# Patient Record
Sex: Female | Born: 1940 | Race: White | Hispanic: No | Marital: Married | Smoking: Former smoker
Health system: Southern US, Community
[De-identification: ages and names within clinical notes are randomized; demographics above are authoritative.]

## PROBLEM LIST (undated history)

## (undated) DIAGNOSIS — I639 Cerebral infarction, unspecified: Secondary | ICD-10-CM

## (undated) HISTORY — PX: CARDIAC SURGERY: SHX584

## (undated) HISTORY — PX: PACEMAKER INSERTION: SHX728

## (undated) HISTORY — PX: COLON SURGERY: SHX602

---

## 2020-11-18 ENCOUNTER — Emergency Department (HOSPITAL_COMMUNITY): Payer: Medicare PPO

## 2020-11-18 ENCOUNTER — Encounter (HOSPITAL_COMMUNITY): Payer: Self-pay | Admitting: *Deleted

## 2020-11-18 ENCOUNTER — Other Ambulatory Visit: Payer: Self-pay

## 2020-11-18 ENCOUNTER — Inpatient Hospital Stay (HOSPITAL_COMMUNITY)
Admission: EM | Admit: 2020-11-18 | Discharge: 2020-11-21 | DRG: 190 | Disposition: A | Discharge status: Home / Self Care | Payer: Medicare PPO | Attending: Family Medicine | Admitting: Family Medicine

## 2020-11-18 DIAGNOSIS — R7989 Other specified abnormal findings of blood chemistry: Secondary | ICD-10-CM

## 2020-11-18 DIAGNOSIS — Z8673 Personal history of transient ischemic attack (TIA), and cerebral infarction without residual deficits: Secondary | ICD-10-CM | POA: Diagnosis not present

## 2020-11-18 DIAGNOSIS — D6489 Other specified anemias: Secondary | ICD-10-CM | POA: Diagnosis present

## 2020-11-18 DIAGNOSIS — E1169 Type 2 diabetes mellitus with other specified complication: Secondary | ICD-10-CM

## 2020-11-18 DIAGNOSIS — I10 Essential (primary) hypertension: Secondary | ICD-10-CM | POA: Diagnosis not present

## 2020-11-18 DIAGNOSIS — Z7901 Long term (current) use of anticoagulants: Secondary | ICD-10-CM

## 2020-11-18 DIAGNOSIS — N184 Chronic kidney disease, stage 4 (severe): Secondary | ICD-10-CM

## 2020-11-18 DIAGNOSIS — I2510 Atherosclerotic heart disease of native coronary artery without angina pectoris: Secondary | ICD-10-CM | POA: Diagnosis present

## 2020-11-18 DIAGNOSIS — I5032 Chronic diastolic (congestive) heart failure: Secondary | ICD-10-CM | POA: Diagnosis present

## 2020-11-18 DIAGNOSIS — E1165 Type 2 diabetes mellitus with hyperglycemia: Secondary | ICD-10-CM | POA: Diagnosis present

## 2020-11-18 DIAGNOSIS — J189 Pneumonia, unspecified organism: Secondary | ICD-10-CM

## 2020-11-18 DIAGNOSIS — R9431 Abnormal electrocardiogram [ECG] [EKG]: Secondary | ICD-10-CM

## 2020-11-18 DIAGNOSIS — I440 Atrioventricular block, first degree: Secondary | ICD-10-CM | POA: Diagnosis present

## 2020-11-18 DIAGNOSIS — Z87891 Personal history of nicotine dependence: Secondary | ICD-10-CM | POA: Diagnosis not present

## 2020-11-18 DIAGNOSIS — I2782 Chronic pulmonary embolism: Secondary | ICD-10-CM

## 2020-11-18 DIAGNOSIS — I2692 Saddle embolus of pulmonary artery without acute cor pulmonale: Secondary | ICD-10-CM

## 2020-11-18 DIAGNOSIS — Z79899 Other long term (current) drug therapy: Secondary | ICD-10-CM | POA: Diagnosis not present

## 2020-11-18 DIAGNOSIS — Z20822 Contact with and (suspected) exposure to covid-19: Secondary | ICD-10-CM | POA: Diagnosis present

## 2020-11-18 DIAGNOSIS — F419 Anxiety disorder, unspecified: Secondary | ICD-10-CM | POA: Diagnosis present

## 2020-11-18 DIAGNOSIS — I160 Hypertensive urgency: Secondary | ICD-10-CM

## 2020-11-18 DIAGNOSIS — E785 Hyperlipidemia, unspecified: Secondary | ICD-10-CM | POA: Diagnosis present

## 2020-11-18 DIAGNOSIS — I130 Hypertensive heart and chronic kidney disease with heart failure and stage 1 through stage 4 chronic kidney disease, or unspecified chronic kidney disease: Secondary | ICD-10-CM | POA: Diagnosis present

## 2020-11-18 DIAGNOSIS — I361 Nonrheumatic tricuspid (valve) insufficiency: Secondary | ICD-10-CM | POA: Diagnosis not present

## 2020-11-18 DIAGNOSIS — E1122 Type 2 diabetes mellitus with diabetic chronic kidney disease: Secondary | ICD-10-CM | POA: Diagnosis present

## 2020-11-18 DIAGNOSIS — I2699 Other pulmonary embolism without acute cor pulmonale: Secondary | ICD-10-CM

## 2020-11-18 DIAGNOSIS — K219 Gastro-esophageal reflux disease without esophagitis: Secondary | ICD-10-CM

## 2020-11-18 DIAGNOSIS — H109 Unspecified conjunctivitis: Secondary | ICD-10-CM

## 2020-11-18 DIAGNOSIS — I2602 Saddle embolus of pulmonary artery with acute cor pulmonale: Secondary | ICD-10-CM

## 2020-11-18 DIAGNOSIS — I482 Chronic atrial fibrillation, unspecified: Secondary | ICD-10-CM

## 2020-11-18 DIAGNOSIS — H1089 Other conjunctivitis: Secondary | ICD-10-CM | POA: Diagnosis present

## 2020-11-18 DIAGNOSIS — R0602 Shortness of breath: Secondary | ICD-10-CM | POA: Diagnosis present

## 2020-11-18 DIAGNOSIS — R0689 Other abnormalities of breathing: Secondary | ICD-10-CM

## 2020-11-18 DIAGNOSIS — J9601 Acute respiratory failure with hypoxia: Secondary | ICD-10-CM | POA: Diagnosis present

## 2020-11-18 DIAGNOSIS — R778 Other specified abnormalities of plasma proteins: Secondary | ICD-10-CM | POA: Diagnosis not present

## 2020-11-18 DIAGNOSIS — Z950 Presence of cardiac pacemaker: Secondary | ICD-10-CM

## 2020-11-18 DIAGNOSIS — R0600 Dyspnea, unspecified: Secondary | ICD-10-CM

## 2020-11-18 DIAGNOSIS — Z794 Long term (current) use of insulin: Secondary | ICD-10-CM | POA: Diagnosis not present

## 2020-11-18 DIAGNOSIS — Z951 Presence of aortocoronary bypass graft: Secondary | ICD-10-CM

## 2020-11-18 DIAGNOSIS — I272 Pulmonary hypertension, unspecified: Secondary | ICD-10-CM | POA: Diagnosis present

## 2020-11-18 DIAGNOSIS — J441 Chronic obstructive pulmonary disease with (acute) exacerbation: Secondary | ICD-10-CM | POA: Diagnosis not present

## 2020-11-18 HISTORY — DX: Cerebral infarction, unspecified: I63.9

## 2020-11-18 LAB — CBC WITH DIFFERENTIAL/PLATELET
Abs Immature Granulocytes: 0.06 10*3/uL (ref 0.00–0.07)
Basophils Absolute: 0 10*3/uL (ref 0.0–0.1)
Basophils Relative: 0 %
Eosinophils Absolute: 0.1 10*3/uL (ref 0.0–0.5)
Eosinophils Relative: 1 %
HCT: 33.9 % — ABNORMAL LOW (ref 36.0–46.0)
Hemoglobin: 11.1 g/dL — ABNORMAL LOW (ref 12.0–15.0)
Immature Granulocytes: 1 %
Lymphocytes Relative: 9 %
Lymphs Abs: 0.7 10*3/uL (ref 0.7–4.0)
MCH: 31.8 pg (ref 26.0–34.0)
MCHC: 32.7 g/dL (ref 30.0–36.0)
MCV: 97.1 fL (ref 80.0–100.0)
Monocytes Absolute: 1.2 10*3/uL — ABNORMAL HIGH (ref 0.1–1.0)
Monocytes Relative: 16 %
Neutro Abs: 5.5 10*3/uL (ref 1.7–7.7)
Neutrophils Relative %: 73 %
Platelets: 168 10*3/uL (ref 150–400)
RBC: 3.49 MIL/uL — ABNORMAL LOW (ref 3.87–5.11)
RDW: 13.8 % (ref 11.5–15.5)
WBC: 7.5 10*3/uL (ref 4.0–10.5)
nRBC: 0 % (ref 0.0–0.2)

## 2020-11-18 LAB — BASIC METABOLIC PANEL
Anion gap: 10 (ref 5–15)
BUN: 48 mg/dL — ABNORMAL HIGH (ref 8–23)
CO2: 20 mmol/L — ABNORMAL LOW (ref 22–32)
Calcium: 8.5 mg/dL — ABNORMAL LOW (ref 8.9–10.3)
Chloride: 105 mmol/L (ref 98–111)
Creatinine, Ser: 2.71 mg/dL — ABNORMAL HIGH (ref 0.44–1.00)
GFR, Estimated: 17 mL/min — ABNORMAL LOW (ref >60)
Glucose, Bld: 184 mg/dL — ABNORMAL HIGH (ref 70–99)
Potassium: 3.5 mmol/L (ref 3.5–5.1)
Sodium: 135 mmol/L (ref 135–145)

## 2020-11-18 LAB — TROPONIN I (HIGH SENSITIVITY): Troponin I (High Sensitivity): 174 ng/L (ref <18)

## 2020-11-18 LAB — RESP PANEL BY RT-PCR (FLU A&B, COVID) ARPGX2
Influenza A by PCR: NEGATIVE
Influenza B by PCR: NEGATIVE
SARS Coronavirus 2 by RT PCR: NEGATIVE

## 2020-11-18 LAB — D-DIMER, QUANTITATIVE: D-Dimer, Quant: 2.85 ug/mL-FEU — ABNORMAL HIGH (ref 0.00–0.50)

## 2020-11-18 MED ORDER — SODIUM CHLORIDE 0.9 % IV SOLN
2.0000 g | Freq: Once | INTRAVENOUS | Status: AC
  Administered 2020-11-18: 2 g via INTRAVENOUS
  Filled 2020-11-18: qty 20

## 2020-11-18 MED ORDER — ERYTHROMYCIN 5 MG/GM OP OINT
TOPICAL_OINTMENT | Freq: Four times a day (QID) | OPHTHALMIC | Status: DC
  Administered 2020-11-19 – 2020-11-20 (×3): 1 via OPHTHALMIC
  Filled 2020-11-18: qty 3.5

## 2020-11-18 MED ORDER — ERYTHROMYCIN 5 MG/GM OP OINT
TOPICAL_OINTMENT | Freq: Four times a day (QID) | OPHTHALMIC | Status: DC
  Filled 2020-11-18: qty 3.5

## 2020-11-18 MED ORDER — SODIUM CHLORIDE 0.9 % IV BOLUS
1000.0000 mL | Freq: Once | INTRAVENOUS | Status: AC
  Administered 2020-11-18: 1000 mL via INTRAVENOUS

## 2020-11-18 MED ORDER — SODIUM CHLORIDE 0.9 % IV SOLN
500.0000 mg | INTRAVENOUS | Status: DC
  Administered 2020-11-18: 500 mg via INTRAVENOUS
  Filled 2020-11-18: qty 500

## 2020-11-18 NOTE — H&P (Signed)
History and Physical  Aryani Daffern DXI:[id number] DOB: Jun 10, 1941 DOA: 11/18/2020  Referring physician: Maudie Flakes, MD PCP: Quentin Cornwall, MD  Patient coming from: Home  Chief Complaint: Shortness of breath  HPI: Kelly Pierce is a 80 y.o. female with medical history significant for hypertension, hyperlipidemia, T2 DM, GERD, stroke on Eliquis and CKD stage IV who presents to the emergency department via EMS accompanied by daughter-in-law due to 1 week of shortness of breath, chest congestion and cough with production of occasional greenish phlegm.  Shortness of breath worsens with ambulation and she complained of some chest soreness from frequent coughing. She also complained of right eye redness and discharge which started 2 days ago.  She denies loss of taste or smell, she also denies fever, headache, headache or abdominal pain. She lives at home with her husband and she ambulates with a cane.  ED Course:  In the emergency department, she was intermittently tachypneic, BP was 216/87.  O2 sat was in the high 80s to low 90s in the ED waiting room, so she was placed on supplemental oxygen via an [place] at 2 LPM with improvement in O2 sat.  Work-up in the ED showed normocytic anemia, BUN to creatinine 40/2.71 (no prior labs for comparison).  D-dimer 2.85, troponin I 174.  Respiratory panel for influenza A, B and SARS coronavirus 2 was negative.  Chest x-ray showed chronic appearing bronchitic type lung changes without acute cardiopulmonary findings. She was empirically treated with IV ceftriaxone and azithromycin, IV hydration was provided. Hospitalist was asked to admit patient for further evaluation and management.  Review of Systems: Constitutional: Negative for chills and fever.  HENT: Negative for ear pain and sore throat.   Eyes: Negative for pain and visual disturbance.  Respiratory: Positive for cough and shortness of breath.   Cardiovascular: Positive for chest soreness (due to frequent  coughing).  Negative for palpitations.  Gastrointestinal: Negative positive for constipation.  For abdominal pain and vomiting.  Endocrine: Negative for polyphagia and polyuria.  Genitourinary: Negative for decreased urine volume, dysuria, enuresis Musculoskeletal: Negative for arthralgias and back pain.  Skin: Negative for color change and rash.  Allergic/Immunologic: Negative for immunocompromised state.  Neurological: Negative for tremors, syncope, speech difficulty, weakness, light-headedness and headaches.  Hematological: Does not bruise/bleed easily.  All other systems reviewed and are negative    Past Medical History:  Diagnosis Date  . Stroke [facility])    Past Surgical History:  Procedure Laterality Date  . CARDIAC SURGERY    . COLON SURGERY    . PACEMAKER INSERTION      Social History:  reports that she has quit smoking. She has quit using smokeless tobacco. She reports previous alcohol use. She reports that she does not use drugs.   Allergies  Allergen Reactions  . Amoxicillin-Pot Clavulanate Rash  . Penicillins Rash    History reviewed. No pertinent family history.   Prior to Admission medications   Medication Sig Start Date End Date Taking? Authorizing Provider  ascorbic acid (VITAMIN C) 500 MG tablet Take 500 mg by mouth daily.   Yes [provider]  atorvastatin (LIPITOR) 80 MG tablet Take 80 mg by mouth at bedtime. 11/07/20  Yes [provider]  Calcium Carbonate-Vitamin D 600-200 MG-UNIT TABS Take 1 tablet by mouth daily.   Yes [provider]  carvedilol (COREG) 25 MG tablet Take 25 mg by mouth 2 (two) times daily. 10/25/20  Yes [provider]  clonazePAM (KLONOPIN) 1 MG tablet Take  1 mg by mouth 2 (two) times daily. 10/27/20  Yes [provider]  ELIQUIS 2.5 MG TABS tablet Take 2.5 mg by mouth 2 (two) times daily. 10/25/20  Yes [provider]  insulin glargine (LANTUS SOLOSTAR) 100 UNIT/ML Solostar Pen  Inject 20 Units into the skin at bedtime.   Yes [provider]  pantoprazole (PROTONIX) 40 MG tablet Take 40 mg by mouth daily. 10/25/20  Yes [provider]  potassium chloride (KLOR-CON) 10 MEQ tablet Take 10 mEq by mouth daily. 11/03/20  Yes [provider]  topiramate (TOPAMAX) 50 MG tablet Take 50 mg by mouth 2 (two) times daily. 10/25/20  Yes [provider]  torsemide (DEMADEX) 20 MG tablet Take 60 mg by mouth daily. 10/25/20  Yes [provider]  traZODone (DESYREL) 100 MG tablet Take 100 mg by mouth at bedtime. 11/02/20  Yes [provider]  amLODipine (NORVASC) 5 MG tablet Take 5 mg by mouth daily as needed.    [provider]    Physical Exam: BP (!) 161/76 (BP Location: Right Arm)   Pulse 84   Temp 98.6 F (37 C) (Oral)   Resp 18   Ht 5\' 2"  (1.575 m)   Wt 60.8 kg   SpO2 91%   BMI 24.51 kg/m   . General: 80 y.o. year-old female well developed well nourished in no acute distress.  Alert and oriented x3. Marland Kitchen HEENT: NCAT, EOMI . Neck: Supple, trachea medial . Cardiovascular: Regular rate and rhythm with no rubs or gallops.  No thyromegaly or JVD noted.  No lower extremity edema. 2/4 pulses in all 4 extremities. Marland Kitchen Respiratory: Tachypnea.  Clear to auscultation with no wheezes or rales.  . Abdomen: Soft nontender nondistended with normal bowel sounds x4 quadrants. . Muskuloskeletal: No cyanosis, clubbing or edema noted bilaterally . Neuro: CN II-XII intact, strength, sensation, reflexes . Skin: No ulcerative lesions noted or rashes . Psychiatry: Judgement and insight appear normal. Mood is appropriate for condition and setting          Labs on Admission:  Basic Metabolic Panel: Recent Labs  Lab 11/18/20 2017  NA 135  K 3.5  CL 105  CO2 20*  GLUCOSE 184*  BUN 48*  CREATININE 2.71*  CALCIUM 8.5*   Liver Function Tests: No results for input(s): AST, ALT, ALKPHOS, BILITOT, PROT, ALBUMIN in the last 168  hours. No results for input(s): LIPASE, AMYLASE in the last 168 hours. No results for input(s): AMMONIA in the last 168 hours. CBC: Recent Labs  Lab 11/18/20 2017  WBC 7.5  NEUTROABS 5.5  HGB 11.1*  HCT 33.9*  MCV 97.1  PLT 168   Cardiac Enzymes: No results for input(s): CKTOTAL, CKMB, CKMBINDEX, TROPONINI in the last 168 hours.  BNP (last 3 results) No results for input(s): BNP in the last 8760 hours.  ProBNP (last 3 results) No results for input(s): PROBNP in the last 8760 hours.  CBG: No results for input(s): GLUCAP in the last 168 hours.  Radiological Exams on Admission: DG Chest Portable 1 View  Result Date: 11/18/2020 CLINICAL DATA:  Shortness of breath EXAM: PORTABLE CHEST 1 VIEW COMPARISON:  None. FINDINGS: Left-sided implanted cardiac device. Post CABG changes. Heart size is upper limits of normal. Densely calcified thoracic aorta. Coarsened interstitial markings bilaterally. No focal airspace consolidation, pleural effusion, or pneumothorax. Degenerative changes of the left glenohumeral joint. IMPRESSION: Chronic appearing bronchitic type lung changes without acute cardiopulmonary findings. Electronically Signed   By: Hart Carwin  Plundo D.O.   On: 11/18/2020 17:56    EKG: I independently viewed the EKG done and my findings are as followed: Normal sinus rhythm first-degree AV block and PACs.  QTc 500 ms  Assessment/Plan Present on Admission: . Acute respiratory failure with hypoxia (HCC)  Principal Problem:   Acute respiratory failure with hypoxia (HCC) Active Problems:   Elevated troponin   Elevated d-dimer   Prolonged QT interval   Bacterial conjunctivitis   CAP (community acquired pneumonia)   Essential hypertension   Hypertensive urgency   Hyperglycemia due to diabetes mellitus (HCC)   Hyperlipidemia associated with type 2 diabetes mellitus (HCC)   GERD (gastroesophageal reflux disease)   CKD (chronic kidney disease), stage IV (HCC)   History of CVA  (cerebrovascular accident)   Atrial fibrillation, chronic (HCC)  Acute respiratory failure with hypoxia possibly secondary to presumed pneumonia ( Viral vs bacterial) Patient was empirically started on IV ceftriaxone and azithromycin, we shall continue with IV ceftriaxone and doxycycline (azithromycin held due to prolonged QTc) with plan to de-escalate/discontinue based on procalcitonin, blood culture, urine Legionella, strep pneumo, sputum culture Continue Mucinex, incentive spirometry, flutter valve  Bacterial conjunctivitis Continue erythromycin ophthalmic ointment  Hypertensive urgency (resolved) Essential hypertension Continue home Coreg and torsemide  Elevated troponin possibly secondary to type II demand ischemia Troponin I - 174; patient denies chest pain EKG shows normal sinus rhythm with first degree AV block with prolonged QTc (500 ms) Continue to trend troponin  Elevated D-dimer rule out pulmonary embolism VQ scan will be checked in the morning CT angiography of chest cannot be done due to CKD  Prolonged QTc Avoid QT prolonging drugs Magnesium level will be checked Continue telemetry  Hyperglycemia secondary to type II DM Continue insulin sliding scale and hypoglycemic protocol  GERD Continue Protonix  CKD stage IV BUN/creatinine 40/2.71 (no prior labs for comparison) Renally adjust medications, avoid nephrotoxic agents/dehydration/hypotension  History of CVA Continue Eliquis, atorvastatin  Chronic atrial fibrillation Patient currently in sinus rhythm Continue Eliquis and Coreg per home regimen  DVT prophylaxis: Eliquis  Code Status: Full code  Family Communication: Daughter-in-law at bedside (all questions answered to satisfaction)  Disposition Plan:  Patient is from:                        home Anticipated DC to:                   SNF or family members home Anticipated DC date:               2-3 days Anticipated DC barriers:           Patient is  unstable to be discharged at this time due to hypoxia and presumed pneumonia requiring inpatient management  Consults called: None  Admission status: Inpatient  Kelly Hoit MD [facility]  11/19/2020, 2:00 AM

## 2020-11-18 NOTE — ED Triage Notes (Signed)
Pt with SOB and cough for over a week.  Right eye with discharge for past 2 days, crusted over "all day".  Pt sats 90% on RA in triage.

## 2020-11-18 NOTE — ED Notes (Signed)
Date and time results received: 11/18/20 2122   Test: trop Critical Value: 174  Name of Provider Notified: Dr. Viona Gilmore  No new orders at this time

## 2020-11-18 NOTE — ED Provider Notes (Signed)
[facility] Emergency Department Provider Note MRN:  [id number]  Arrival date & time: 11/18/20     Chief Complaint   Shortness of Breath   History of Present Illness   Kelly Pierce is a 80 y.o. year-old female with a history of stroke, diabetes presenting to the ED with chief complaint of shortness of breath.  Cough and shortness of breath for a week.  Right eye redness and discharge for the past 2 days.  Denies chest pain, no headache or vision change, no abdominal pain, no other complaints.  Symptoms are mild to moderate, constant, no exacerbating or alleviating factors.  Review of Systems  A complete 10 system review of systems was obtained and all systems are negative except as noted in the HPI and PMH.   Patient's Health History    Past Medical History:  Diagnosis Date  . Stroke [facility])     Past Surgical History:  Procedure Laterality Date  . CARDIAC SURGERY    . COLON SURGERY    . PACEMAKER INSERTION      History reviewed. No pertinent family history.  Social History   Socioeconomic History  . Marital status: Married    Spouse name: Not on file  . Number of children: Not on file  . Years of education: Not on file  . Highest education level: Not on file  Occupational History  . Not on file  Tobacco Use  . Smoking status: Former Research scientist (life sciences)  . Smokeless tobacco: Former Network engineer and Sexual Activity  . Alcohol use: Not Currently  . Drug use: Never  . Sexual activity: Not on file  Other Topics Concern  . Not on file  Social History Narrative  . Not on file   Social Determinants of Health   Financial Resource Strain: Not on file  Food Insecurity: Not on file  Transportation Needs: Not on file  Physical Activity: Not on file  Stress: Not on file  Social Connections: Not on file  Intimate Partner Violence: Not on file     Physical Exam   Vitals:   11/18/20 2200 11/18/20 2300  BP: (!) 168/69 (!) 147/80  Pulse: 76 76  Resp: (!)  35 (!) 25  Temp:    SpO2: 97% 98%    CONSTITUTIONAL: Chronically ill-appearing, NAD NEURO:  Alert and oriented x 3, no focal deficits EYES:  eyes equal and reactive ENT/NECK:  no LAD, no JVD CARDIO: Regular rate, well-perfused, normal S1 and S2 PULM: Mild tachypnea and retractions, otherwise clear to auscultation GI/GU:  normal bowel sounds, non-distended, non-tender MSK/SPINE:  No gross deformities, no edema SKIN:  no rash, atraumatic PSYCH:  Appropriate speech and behavior  *Additional and/or pertinent findings included in MDM below  Diagnostic and Interventional Summary    EKG Interpretation  Date/Time:  Saturday November 18 2020 17:17:08 EST Ventricular Rate:  94 PR Interval:  216 QRS Duration: 148 QT Interval:  400 QTC Calculation: 500 R Axis:   -69 Text Interpretation: Sinus rhythm with 1st degree A-V block with Premature atrial complexes Left axis deviation Non-specific intra-ventricular conduction block Minimal voltage criteria for LVH, may be normal variant ( Cornell product ) Abnormal ECG Confirmed by Gerlene Fee [phone]) on 11/18/2020 8:41:58 PM      Labs Reviewed  CBC WITH DIFFERENTIAL/PLATELET - Abnormal; Notable for the following components:      Result Value   RBC 3.49 (*)    Hemoglobin 11.1 (*)    HCT 33.9 (*)  Monocytes Absolute 1.2 (*)    All other components within normal limits  BASIC METABOLIC PANEL - Abnormal; Notable for the following components:   CO2 20 (*)    Glucose, Bld 184 (*)    BUN 48 (*)    Creatinine, Ser 2.71 (*)    Calcium 8.5 (*)    GFR, Estimated 17 (*)    All other components within normal limits  D-DIMER, QUANTITATIVE (NOT AT [facility]) - Abnormal; Notable for the following components:   D-Dimer, Quant 2.85 (*)    All other components within normal limits  TROPONIN I (HIGH SENSITIVITY) - Abnormal; Notable for the following components:   Troponin I (High Sensitivity) 174 (*)    All other components within normal limits  RESP PANEL  BY RT-PCR (FLU A&B, COVID) [id number]  TROPONIN I (HIGH SENSITIVITY)    DG Chest Portable 1 View  Final Result      Medications  azithromycin (ZITHROMAX) 500 mg in sodium chloride 0.9 % 250 mL IVPB (500 mg Intravenous New Bag/Given 11/18/20 2308)  erythromycin ophthalmic ointment ( Right Eye Given 11/18/20 2219)  cefTRIAXone (ROCEPHIN) 2 g in sodium chloride 0.9 % 100 mL IVPB (0 g Intravenous Stopped 11/18/20 2254)  sodium chloride 0.9 % bolus 1,000 mL (1,000 mLs Intravenous New Bag/Given 11/18/20 2218)     Procedures  /  Critical Care Procedures  ED Course and Medical Decision Making  I have reviewed the triage vital signs, the nursing notes, and pertinent available records from the EMR.  Listed above are laboratory and imaging tests that I personally ordered, reviewed, and interpreted and then considered in my medical decision making (see below for details).  Clinically suspicious for pneumonia versus viral illness.  Patient with oxygen saturations in the high 80s to low 90s, placed on 2 L nasal cannula.  Will need admission.  Troponin is elevated, patient denies chest pain, favoring infectious process but with the elevated troponin and shortness of breath, this somewhat raises the concern for pulmonary embolism.  Her kidney function is poor, will admit for further care, would consider V/Q.     Admitted to hospitalist service for further care.  Barth Kirks. Sedonia Small, MD [place] [email or web address]  Final Clinical Impressions(s) / ED Diagnoses     ICD-10-CM   1. Community acquired pneumonia, unspecified laterality  J18.9   2. Elevated troponin  R77.8     ED Discharge Orders    None       Discharge Instructions Discussed with and Provided to Patient:   Discharge Instructions   None       Maudie Flakes, MD 11/18/20 [phone]

## 2020-11-19 ENCOUNTER — Inpatient Hospital Stay (HOSPITAL_COMMUNITY): Payer: Medicare PPO

## 2020-11-19 DIAGNOSIS — K219 Gastro-esophageal reflux disease without esophagitis: Secondary | ICD-10-CM

## 2020-11-19 DIAGNOSIS — E785 Hyperlipidemia, unspecified: Secondary | ICD-10-CM

## 2020-11-19 DIAGNOSIS — I361 Nonrheumatic tricuspid (valve) insufficiency: Secondary | ICD-10-CM | POA: Diagnosis not present

## 2020-11-19 DIAGNOSIS — R0602 Shortness of breath: Secondary | ICD-10-CM | POA: Diagnosis not present

## 2020-11-19 DIAGNOSIS — I160 Hypertensive urgency: Secondary | ICD-10-CM

## 2020-11-19 DIAGNOSIS — E1169 Type 2 diabetes mellitus with other specified complication: Secondary | ICD-10-CM

## 2020-11-19 DIAGNOSIS — R9431 Abnormal electrocardiogram [ECG] [EKG]: Secondary | ICD-10-CM

## 2020-11-19 DIAGNOSIS — J189 Pneumonia, unspecified organism: Secondary | ICD-10-CM

## 2020-11-19 DIAGNOSIS — E1165 Type 2 diabetes mellitus with hyperglycemia: Secondary | ICD-10-CM

## 2020-11-19 DIAGNOSIS — R778 Other specified abnormalities of plasma proteins: Secondary | ICD-10-CM

## 2020-11-19 DIAGNOSIS — N184 Chronic kidney disease, stage 4 (severe): Secondary | ICD-10-CM

## 2020-11-19 DIAGNOSIS — R7989 Other specified abnormal findings of blood chemistry: Secondary | ICD-10-CM

## 2020-11-19 DIAGNOSIS — H109 Unspecified conjunctivitis: Secondary | ICD-10-CM

## 2020-11-19 DIAGNOSIS — I10 Essential (primary) hypertension: Secondary | ICD-10-CM

## 2020-11-19 DIAGNOSIS — I482 Chronic atrial fibrillation, unspecified: Secondary | ICD-10-CM

## 2020-11-19 DIAGNOSIS — Z8673 Personal history of transient ischemic attack (TIA), and cerebral infarction without residual deficits: Secondary | ICD-10-CM

## 2020-11-19 DIAGNOSIS — J9601 Acute respiratory failure with hypoxia: Secondary | ICD-10-CM | POA: Diagnosis not present

## 2020-11-19 LAB — HEMOGLOBIN A1C
Hgb A1c MFr Bld: 5.8 % — ABNORMAL HIGH (ref 4.8–5.6)
Mean Plasma Glucose: 119.76 mg/dL

## 2020-11-19 LAB — COMPREHENSIVE METABOLIC PANEL
ALT: 22 U/L (ref 0–44)
AST: 22 U/L (ref 15–41)
Albumin: 2.7 g/dL — ABNORMAL LOW (ref 3.5–5.0)
Alkaline Phosphatase: 80 U/L (ref 38–126)
Anion gap: 5 (ref 5–15)
BUN: 49 mg/dL — ABNORMAL HIGH (ref 8–23)
CO2: 21 mmol/L — ABNORMAL LOW (ref 22–32)
Calcium: 8.2 mg/dL — ABNORMAL LOW (ref 8.9–10.3)
Chloride: 107 mmol/L (ref 98–111)
Creatinine, Ser: 2.69 mg/dL — ABNORMAL HIGH (ref 0.44–1.00)
GFR, Estimated: 17 mL/min — ABNORMAL LOW (ref >60)
Glucose, Bld: 146 mg/dL — ABNORMAL HIGH (ref 70–99)
Potassium: 3.4 mmol/L — ABNORMAL LOW (ref 3.5–5.1)
Sodium: 133 mmol/L — ABNORMAL LOW (ref 135–145)
Total Bilirubin: 1.1 mg/dL (ref 0.3–1.2)
Total Protein: 6.6 g/dL (ref 6.5–8.1)

## 2020-11-19 LAB — CBC
HCT: 33.5 % — ABNORMAL LOW (ref 36.0–46.0)
Hemoglobin: 10.3 g/dL — ABNORMAL LOW (ref 12.0–15.0)
MCH: 30.1 pg (ref 26.0–34.0)
MCHC: 30.7 g/dL (ref 30.0–36.0)
MCV: 98 fL (ref 80.0–100.0)
Platelets: 153 10*3/uL (ref 150–400)
RBC: 3.42 MIL/uL — ABNORMAL LOW (ref 3.87–5.11)
RDW: 13.8 % (ref 11.5–15.5)
WBC: 5.3 10*3/uL (ref 4.0–10.5)
nRBC: 0 % (ref 0.0–0.2)

## 2020-11-19 LAB — PROTIME-INR
INR: 1.5 — ABNORMAL HIGH (ref 0.8–1.2)
Prothrombin Time: 17.7 seconds — ABNORMAL HIGH (ref 11.4–15.2)

## 2020-11-19 LAB — GLUCOSE, CAPILLARY
Glucose-Capillary: 126 mg/dL — ABNORMAL HIGH (ref 70–99)
Glucose-Capillary: 136 mg/dL — ABNORMAL HIGH (ref 70–99)
Glucose-Capillary: 103 mg/dL — ABNORMAL HIGH (ref 70–99)
Glucose-Capillary: 225 mg/dL — ABNORMAL HIGH (ref 70–99)

## 2020-11-19 LAB — ECHOCARDIOGRAM COMPLETE
Area-P 1/2: 2.97 cm2
Calc EF: 44.9 %
Height: 62 in
S' Lateral: 3.26 cm
Single Plane A2C EF: 45.3 %
Single Plane A4C EF: 38.4 %
Weight: 2144 oz

## 2020-11-19 LAB — APTT: aPTT: 46 seconds — ABNORMAL HIGH (ref 24–36)

## 2020-11-19 LAB — TROPONIN I (HIGH SENSITIVITY): Troponin I (High Sensitivity): 159 ng/L (ref <18)

## 2020-11-19 LAB — PHOSPHORUS: Phosphorus: 3.5 mg/dL (ref 2.5–4.6)

## 2020-11-19 LAB — MAGNESIUM: Magnesium: 2.6 mg/dL — ABNORMAL HIGH (ref 1.7–2.4)

## 2020-11-19 MED ORDER — APIXABAN 2.5 MG PO TABS
2.5000 mg | ORAL_TABLET | Freq: Two times a day (BID) | ORAL | Status: DC
  Administered 2020-11-19 – 2020-11-21 (×5): 2.5 mg via ORAL
  Filled 2020-11-19 (×5): qty 1

## 2020-11-19 MED ORDER — CARVEDILOL 12.5 MG PO TABS
25.0000 mg | ORAL_TABLET | Freq: Two times a day (BID) | ORAL | Status: DC
  Administered 2020-11-19 – 2020-11-21 (×5): 25 mg via ORAL
  Filled 2020-11-19 (×5): qty 2

## 2020-11-19 MED ORDER — INSULIN ASPART 100 UNIT/ML ~~LOC~~ SOLN
0.0000 [IU] | Freq: Three times a day (TID) | SUBCUTANEOUS | Status: DC
  Administered 2020-11-19 (×2): 1 [IU] via SUBCUTANEOUS
  Administered 2020-11-20 (×2): 2 [IU] via SUBCUTANEOUS
  Administered 2020-11-20: 5 [IU] via SUBCUTANEOUS
  Administered 2020-11-21: 3 [IU] via SUBCUTANEOUS

## 2020-11-19 MED ORDER — POTASSIUM CHLORIDE ER 10 MEQ PO TBCR
10.0000 meq | EXTENDED_RELEASE_TABLET | Freq: Every day | ORAL | Status: DC
  Administered 2020-11-19 – 2020-11-21 (×3): 10 meq via ORAL
  Filled 2020-11-19 (×7): qty 1

## 2020-11-19 MED ORDER — ACETAMINOPHEN 325 MG PO TABS: 650.0000 mg | ORAL_TABLET | Freq: Four times a day (QID) | ORAL | Status: DC | PRN

## 2020-11-19 MED ORDER — CEFTRIAXONE SODIUM 1 G IJ SOLR
1.0000 g | INTRAMUSCULAR | Status: DC
  Filled 2020-11-19: qty 10

## 2020-11-19 MED ORDER — TECHNETIUM TO 99M ALBUMIN AGGREGATED
4.3000 | Freq: Once | INTRAVENOUS | Status: AC | PRN
  Administered 2020-11-19: 4.3 via INTRAVENOUS

## 2020-11-19 MED ORDER — AMLODIPINE BESYLATE 5 MG PO TABS
5.0000 mg | ORAL_TABLET | Freq: Every day | ORAL | Status: DC
  Administered 2020-11-19 – 2020-11-20 (×2): 5 mg via ORAL
  Filled 2020-11-19 (×2): qty 1

## 2020-11-19 MED ORDER — INSULIN ASPART 100 UNIT/ML ~~LOC~~ SOLN
0.0000 [IU] | Freq: Every day | SUBCUTANEOUS | Status: DC
  Administered 2020-11-19: 2 [IU] via SUBCUTANEOUS

## 2020-11-19 MED ORDER — TORSEMIDE 20 MG PO TABS
60.0000 mg | ORAL_TABLET | Freq: Every day | ORAL | Status: DC
  Administered 2020-11-19 – 2020-11-21 (×3): 60 mg via ORAL
  Filled 2020-11-19 (×3): qty 3

## 2020-11-19 MED ORDER — GUAIFENESIN-DM 100-10 MG/5ML PO SYRP
5.0000 mL | ORAL_SOLUTION | ORAL | Status: DC | PRN
  Administered 2020-11-19 – 2020-11-21 (×3): 5 mL via ORAL
  Filled 2020-11-19 (×3): qty 5

## 2020-11-19 MED ORDER — CLONAZEPAM 0.5 MG PO TABS
0.5000 mg | ORAL_TABLET | Freq: Two times a day (BID) | ORAL | Status: DC
  Administered 2020-11-19 – 2020-11-21 (×5): 0.5 mg via ORAL
  Filled 2020-11-19 (×5): qty 1

## 2020-11-19 MED ORDER — DM-GUAIFENESIN ER 30-600 MG PO TB12
1.0000 | ORAL_TABLET | Freq: Two times a day (BID) | ORAL | Status: DC
  Administered 2020-11-19 – 2020-11-21 (×6): 1 via ORAL
  Filled 2020-11-19 (×6): qty 1

## 2020-11-19 MED ORDER — PANTOPRAZOLE SODIUM 40 MG PO TBEC
40.0000 mg | DELAYED_RELEASE_TABLET | Freq: Every day | ORAL | Status: DC
  Administered 2020-11-19 – 2020-11-21 (×3): 40 mg via ORAL
  Filled 2020-11-19 (×3): qty 1

## 2020-11-19 MED ORDER — METHYLPREDNISOLONE SODIUM SUCC 40 MG IJ SOLR
40.0000 mg | Freq: Three times a day (TID) | INTRAMUSCULAR | Status: DC
  Administered 2020-11-19 – 2020-11-21 (×6): 40 mg via INTRAVENOUS
  Filled 2020-11-19 (×6): qty 1

## 2020-11-19 MED ORDER — SODIUM CHLORIDE 0.9 % IV SOLN
100.0000 mg | Freq: Two times a day (BID) | INTRAVENOUS | Status: DC
  Administered 2020-11-19 – 2020-11-20 (×3): 100 mg via INTRAVENOUS
  Filled 2020-11-19 (×6): qty 100

## 2020-11-19 MED ORDER — ATORVASTATIN CALCIUM 40 MG PO TABS
80.0000 mg | ORAL_TABLET | Freq: Every day | ORAL | Status: DC
  Administered 2020-11-19 – 2020-11-20 (×2): 80 mg via ORAL
  Filled 2020-11-19 (×2): qty 2

## 2020-11-19 NOTE — ED Notes (Signed)
Report given to [facility].

## 2020-11-19 NOTE — Progress Notes (Signed)
  Echocardiogram 2D Echocardiogram has been performed.  Kelly Pierce 11/19/2020, 11:44 AM

## 2020-11-19 NOTE — Plan of Care (Signed)

## 2020-11-19 NOTE — Progress Notes (Signed)
Patient Demographics:    Kelly Pierce, is a 80 y.o. female, DOB - [birth year], KZL:[id number]  Admit date - 11/18/2020   Admitting Physician Bernadette Hoit, DO  Outpatient Primary MD for the patient is Milam, Florene Route, MD  LOS - 1  Chief Complaint  Patient presents with  . Shortness of Breath        Subjective:    Kelly Pierce today has no fevers, no emesis,  No chest pain,   Able to wean off oxygen, still has cough and shob and DOE   Assessment  & Plan :    Principal Problem:   Acute respiratory failure with hypoxia (HCC) Active Problems:   Elevated troponin   Elevated d-dimer   Prolonged QT interval   Bacterial conjunctivitis   CAP (community acquired pneumonia)   Essential hypertension   Hypertensive urgency   Hyperglycemia due to diabetes mellitus (HCC)   Hyperlipidemia associated with type 2 diabetes mellitus (HCC)   GERD (gastroesophageal reflux disease)   CKD (chronic kidney disease), stage IV (HCC)   History of CVA (cerebrovascular accident)   Atrial fibrillation, chronic (HCC)  BRIEF SUMMARY:- 80 y.o. female with medical history significant for hypertension, COPD hyperlipidemia, T2 DM, GERD, stroke on Eliquis and CKD stage IV admitted on 11/18/2020 with acute hypoxic respiratory failure presumably secondary to pneumonia--  A/p 1) acute COPD exacerbation ---  - continue mucolytics bronchodilators and supplemental oxygen -Solu-Medrol as ordered -Continue doxycycline -Able to wean off oxygen, still has cough and shob and DOE   2) acute hypoxic respiratory failure--secondary to #1 above, treat as above #1 -Able to wean off oxygen, --Suspect hypoxia was secondary to COPD exacerbation rather than PE  3) anxiety disorder--decrease clonazepam to 0.5 mg twice daily from 1 mg twice daily due to concerns about hypoxia  4)Chronic Atrial Fibrillation--- continue carvedilol for rate control and  Eliquis for secondary stroke prophylaxis  5)DM2- Check A1c, Use Novolog/Humalog Sliding scale insulin with Accu-Cheks/Fingersticks as ordered   6)Elevated D-Dimer--- VQ scan findings Discussed with radiologist  (favor chronic lung findings over acute PE )and LE Venous Dopplers are Neg -Patient and husband tells me that patient has been compliant with Eliquis -No pleuritic symptoms, hypoxia resolved--- - c/n Eliquis (for A.Fib)  7)HFpEF/Elevated Troponin--- patient with chronic grade 2 diastolic dysfunction CHF --c/n Coreg, EF 60 to 65%, no wall motion maladies, grade 2 diastolic dysfunction noted, patient also has moderate pulmonary hypertension -Continue torsemide  8)Chronic Anemia--- hgb is 10.3 (down from 11.1 on admission)  9)H/o prior stroke--Eliquis as above #4, continue atorvastatin  10)HTN--BP is not at goal, continue Coreg, add amlodipine  11)CAD/arrhythmias--patient with left-sided pacemaker, status post prior CABG, continue atorvastatin and Coreg -No aspirin, as pt is  already on Eliquis  12)  CKD stage - IV--- renal function appears to be close to baseline,  renally adjust medications, avoid nephrotoxic agents / dehydration  / hypotension   Disposition/Need for in-Hospital Stay- patient unable to be discharged at this time due to -acute COPD exacerbation requiring IV steroids, IV antibiotics and weaning off oxygen  Status is: Inpatient  Remains inpatient appropriate because:Please see above   Disposition: The patient is from: Home  Anticipated d/c is to: Home              Anticipated d/c date is: 1 day              Patient currently is not medically stable to d/c. Barriers: Not Clinically Stable-   Code Status :  -  Code Status: Full Code   Family Communication:   Discussed with husband,  (patient is alert, awake and coherent)   Consults  :  na  DVT Prophylaxis  :   - SCDs  apixaban (ELIQUIS) tablet 2.5 mg Start: 11/19/20 1000 SCDs Start: 11/19/20  0003 apixaban (ELIQUIS) tablet 2.5 mg    Lab Results  Component Value Date   PLT 153 11/19/2020    Inpatient Medications  Scheduled Meds: . apixaban  2.5 mg Oral BID  . atorvastatin  80 mg Oral QHS  . carvedilol  25 mg Oral BID  . cefTRIAXone (ROCEPHIN) IM  1 g Intramuscular Q24H  . clonazePAM  0.5 mg Oral BID  . dextromethorphan-guaiFENesin  1 tablet Oral BID  . erythromycin   Right Eye Q6H  . insulin aspart  0-5 Units Subcutaneous QHS  . insulin aspart  0-9 Units Subcutaneous TID WC  . pantoprazole  40 mg Oral Daily  . potassium chloride  10 mEq Oral Daily  . torsemide  60 mg Oral Daily   Continuous Infusions: . doxycycline (VIBRAMYCIN) IV 100 mg (11/19/20 0832)   PRN Meds:.acetaminophen    Anti-infectives (From admission, onward)   Start     Dose/Rate Route Frequency Ordered Stop   11/19/20 2200  cefTRIAXone (ROCEPHIN) injection 1 g        1 g Intramuscular Every 24 hours 11/19/20 0207     11/19/20 0800  doxycycline (VIBRAMYCIN) 100 mg in sodium chloride 0.9 % 250 mL IVPB        100 mg 125 mL/hr over 120 Minutes Intravenous Every 12 hours 11/19/20 0207     11/18/20 2130  cefTRIAXone (ROCEPHIN) 2 g in sodium chloride 0.9 % 100 mL IVPB        2 g 200 mL/hr over 30 Minutes Intravenous  Once 11/18/20 2128 11/18/20 2254   11/18/20 2130  azithromycin (ZITHROMAX) 500 mg in sodium chloride 0.9 % 250 mL IVPB  Status:  Discontinued        500 mg 250 mL/hr over 60 Minutes Intravenous Every 24 hours 11/18/20 2128 11/19/20 0207        Objective:   Vitals:   11/18/20 2300 11/18/20 2330 11/19/20 0103 11/19/20 0549  BP: (!) 147/80 (!) 143/71 (!) 161/76 (!) 174/93  Pulse: 76 77 84 73  Resp: (!) 25 (!) 21 18 17   Temp:   98.6 F (37 C) 98.8 F (37.1 C)  TempSrc:   Oral Oral  SpO2: 98% 97% 91% 96%  Weight:      Height:        Wt Readings from Last 3 Encounters:  11/18/20 60.8 kg     Intake/Output Summary (Last 24 hours) at 11/19/2020 2774 Last data filed at 11/19/2020  0400 Gross per 24 hour  Intake 298.02 ml  Output --  Net 298.02 ml    Physical Exam  Gen:- Awake Alert, in no acute distress HEENT:- [place].AT, No sclera icterus Nose- [place] 2L/min----weaned off oxygen Neck-Supple Neck,No JVD,.  Lungs-air movement is fair with scattered wheezes  CV- S1, S2 normal, regular , CABG, left-sided pacer Abd-  +ve B.Sounds, Abd Soft, No tenderness,  Extremity/Skin:- No  edema, pedal pulses present Psych-affect is appropriate, oriented x3 Neuro-generalized weakness, no new focal deficits, no tremors   Data Review:   Micro Results Recent Results (from the past 240 hour(s))  Resp Panel by RT-PCR (Flu A&B, Covid) Nasopharyngeal Swab     Status: None   Collection Time: 11/18/20  5:20 PM   Specimen: Nasopharyngeal Swab; Nasopharyngeal(NP) swabs in vial transport medium  Result Value Ref Range Status   SARS Coronavirus 2 by RT PCR NEGATIVE NEGATIVE Final    Comment: (NOTE) SARS-CoV-2 target nucleic acids are NOT DETECTED.  The SARS-CoV-2 RNA is generally detectable in upper respiratory specimens during the acute phase of infection. The lowest concentration of SARS-CoV-2 viral copies this assay can detect is 138 copies/mL. A negative result does not preclude SARS-Cov-2 infection and should not be used as the sole basis for treatment or other patient management decisions. A negative result may occur with  improper specimen collection/handling, submission of specimen other than nasopharyngeal swab, presence of viral mutation(s) within the areas targeted by this assay, and inadequate number of viral copies(<138 copies/mL). A negative result must be combined with clinical observations, patient history, and epidemiological information. The expected result is Negative.  Fact Sheet for Patients:  EntrepreneurPulse.com.au  Fact Sheet for Healthcare Providers:  IncredibleEmployment.be  This test is no t yet approved or cleared  by the [place] FDA and  has been authorized for detection and/or diagnosis of SARS-CoV-2 by FDA under an Emergency Use Authorization (EUA). This EUA will remain  in effect (meaning this test can be used) for the duration of the COVID-19 declaration under Section 564(b)(1) of the Act, 21 U.S.C.section 360bbb-3(b)(1), unless the authorization is terminated  or revoked sooner.       Influenza A by PCR NEGATIVE NEGATIVE Final   Influenza B by PCR NEGATIVE NEGATIVE Final    Comment: (NOTE) The Xpert Xpress SARS-CoV-2/FLU/RSV plus assay is intended as an aid in the diagnosis of influenza from Nasopharyngeal swab specimens and should not be used as a sole basis for treatment. Nasal washings and aspirates are unacceptable for Xpert Xpress SARS-CoV-2/FLU/RSV testing.  Fact Sheet for Patients: EntrepreneurPulse.com.au  Fact Sheet for Healthcare Providers: IncredibleEmployment.be  This test is not yet approved or cleared by the [place] FDA and has been authorized for detection and/or diagnosis of SARS-CoV-2 by FDA under an Emergency Use Authorization (EUA). This EUA will remain in effect (meaning this test can be used) for the duration of the COVID-19 declaration under Section 564(b)(1) of the Act, 21 U.S.C. section 360bbb-3(b)(1), unless the authorization is terminated or revoked.  Performed at [facility], [address], [place], [place] [address]     Radiology Reports DG Chest Portable 1 View  Result Date: 11/18/2020 CLINICAL DATA:  Shortness of breath EXAM: PORTABLE CHEST 1 VIEW COMPARISON:  None. FINDINGS: Left-sided implanted cardiac device. Post CABG changes. Heart size is upper limits of normal. Densely calcified thoracic aorta. Coarsened interstitial markings bilaterally. No focal airspace consolidation, pleural effusion, or pneumothorax. Degenerative changes of the left glenohumeral joint. IMPRESSION: Chronic appearing bronchitic  type lung changes without acute cardiopulmonary findings. Electronically Signed   By: Davina Poke D.O.   On: 11/18/2020 17:56     CBC Recent Labs  Lab 11/18/20 2017 11/19/20 0633  WBC 7.5 5.3  HGB 11.1* 10.3*  HCT 33.9* 33.5*  PLT 168 153  MCV 97.1 98.0  MCH 31.8 30.1  MCHC 32.7 30.7  RDW 13.8 13.8  LYMPHSABS 0.7  --  MONOABS 1.2*  --   EOSABS 0.1  --   BASOSABS 0.0  --     Chemistries  Recent Labs  Lab 11/18/20 2017  NA 135  K 3.5  CL 105  CO2 20*  GLUCOSE 184*  BUN 48*  CREATININE 2.71*  CALCIUM 8.5*   ------------------------------------------------------------------------------------------------------------------ No results for input(s): CHOL, HDL, LDLCALC, TRIG, CHOLHDL, LDLDIRECT in the last 72 hours.  No results found for: HGBA1C ------------------------------------------------------------------------------------------------------------------ No results for input(s): TSH, T4TOTAL, T3FREE, THYROIDAB in the last 72 hours.  Invalid input(s): FREET3 ------------------------------------------------------------------------------------------------------------------ No results for input(s): VITAMINB12, FOLATE, FERRITIN, TIBC, IRON, RETICCTPCT in the last 72 hours.  Coagulation profile Recent Labs  Lab 11/19/20 0633  INR 1.5*    Recent Labs    11/18/20 2018  DDIMER 2.85*    Cardiac Enzymes No results for input(s): CKMB, TROPONINI, MYOGLOBIN in the last 168 hours.  Invalid input(s): CK ------------------------------------------------------------------------------------------------------------------ No results found for: BNP   Roxan Hockey M.D on 11/19/2020 at 9:06 AM  Go to [email or web address] - for contact info  [facility] - Office  [phone]

## 2020-11-20 DIAGNOSIS — J9601 Acute respiratory failure with hypoxia: Secondary | ICD-10-CM | POA: Diagnosis not present

## 2020-11-20 LAB — BASIC METABOLIC PANEL
Anion gap: 11 (ref 5–15)
BUN: 53 mg/dL — ABNORMAL HIGH (ref 8–23)
CO2: 21 mmol/L — ABNORMAL LOW (ref 22–32)
Calcium: 8.8 mg/dL — ABNORMAL LOW (ref 8.9–10.3)
Chloride: 106 mmol/L (ref 98–111)
Creatinine, Ser: 2.4 mg/dL — ABNORMAL HIGH (ref 0.44–1.00)
GFR, Estimated: 20 mL/min — ABNORMAL LOW (ref >60)
Glucose, Bld: 207 mg/dL — ABNORMAL HIGH (ref 70–99)
Potassium: 3.8 mmol/L (ref 3.5–5.1)
Sodium: 138 mmol/L (ref 135–145)

## 2020-11-20 LAB — GLUCOSE, CAPILLARY
Glucose-Capillary: 171 mg/dL — ABNORMAL HIGH (ref 70–99)
Glucose-Capillary: 170 mg/dL — ABNORMAL HIGH (ref 70–99)
Glucose-Capillary: 246 mg/dL — ABNORMAL HIGH (ref 70–99)

## 2020-11-20 MED ORDER — DOXYCYCLINE HYCLATE 100 MG PO TABS
100.0000 mg | ORAL_TABLET | Freq: Two times a day (BID) | ORAL | Status: DC
  Administered 2020-11-20 – 2020-11-21 (×2): 100 mg via ORAL
  Filled 2020-11-20 (×2): qty 1

## 2020-11-20 NOTE — Progress Notes (Signed)
Patient Demographics:    Kelly Pierce, is a 80 y.o. female, DOB - 10-20-1941, SRP:[id number]  Admit date - 11/18/2020   Admitting Physician Bernadette Hoit, DO  Outpatient Primary MD for the patient is Milam, Florene Route, MD  LOS - 2  Chief Complaint  Patient presents with  . Shortness of Breath        Subjective:    Kelly Pierce today has no fevers, no emesis,  No chest pain,   -Remains dyspneic with exertion, cough or shortness of breath persist -Hypoxia at rest as resolved  Assessment  & Plan :    Principal Problem:   Acute respiratory failure with hypoxia (HCC) Active Problems:   Elevated troponin   Elevated d-dimer   Prolonged QT interval   Bacterial conjunctivitis   CAP (community acquired pneumonia)   Essential hypertension   Hypertensive urgency   Hyperglycemia due to diabetes mellitus (HCC)   Hyperlipidemia associated with type 2 diabetes mellitus (HCC)   GERD (gastroesophageal reflux disease)   CKD (chronic kidney disease), stage IV (HCC)   History of CVA (cerebrovascular accident)   Atrial fibrillation, chronic (HCC)  BRIEF SUMMARY:- 80 y.o. female with medical history significant for hypertension, COPD hyperlipidemia, T2 DM, GERD, stroke on Eliquis and CKD stage IV admitted on 11/18/2020 with acute hypoxic respiratory failure presumably secondary to pneumonia--  A/p 1) acute COPD exacerbation ---  - continue mucolytics bronchodilators and supplemental oxygen -iv Solu-Medrol as ordered -Continue doxycycline -Able to wean off oxygen, still has cough and shob and DOE  -Anticipate possible discharge on 11/21/2020  2) acute hypoxic respiratory failure--secondary to #1 above, treat as above #1 -was Able to wean off oxygen, --Suspect hypoxia was secondary to COPD exacerbation rather than PE  3) anxiety disorder--decrease clonazepam to 0.5 mg twice daily from 1 mg twice daily due to concerns  about hypoxia  4)Chronic Atrial Fibrillation--- continue carvedilol for rate control and Eliquis for secondary stroke prophylaxis  5)DM2- Check A1c, Use Novolog/Humalog Sliding scale insulin with Accu-Cheks/Fingersticks as ordered   6)Elevated D-Dimer--- VQ scan findings Discussed with radiologist  (favor chronic lung findings over acute PE )and LE Venous Dopplers are Neg -Patient and husband tells me that patient has been compliant with Eliquis -No pleuritic symptoms, hypoxia resolved--- - c/n Eliquis (for A.Fib)  7)HFpEF/Elevated Troponin--- patient with chronic grade 2 diastolic dysfunction CHF --c/n Coreg, EF 60 to 65%, no wall motion maladies, grade 2 diastolic dysfunction noted, patient also has moderate pulmonary hypertension -Continue torsemide  8)Chronic Anemia--- hgb is 10.3 (down from 11.1 on admission)  9)H/o prior stroke--Eliquis as above #4, continue atorvastatin  10)HTN--BP is not at goal, continue Coreg, add amlodipine  11)CAD/arrhythmias--patient with left-sided pacemaker, status post prior CABG, continue atorvastatin and Coreg -No aspirin, as pt is  already on Eliquis  12)  CKD stage - IV--- renal function appears to be close to baseline,  renally adjust medications, avoid nephrotoxic agents / dehydration  / hypotension   Disposition/Need for in-Hospital Stay- patient unable to be discharged at this time due to -acute COPD exacerbation requiring IV steroids, IV antibiotics and weaning off oxygen -Anticipate possible discharge on 11/21/2020  Status is: Inpatient  Remains inpatient appropriate because:Please see above   Disposition: The patient is from:  Home              Anticipated d/c is to: Home              Anticipated d/c date is: 1 day              Patient currently is not medically stable to d/c. Barriers: Not Clinically Stable-   Code Status :  -  Code Status: Full Code   Family Communication:   Discussed with husband,  (patient is alert, awake and  coherent)   Consults  :  na  DVT Prophylaxis  :   - SCDs  apixaban (ELIQUIS) tablet 2.5 mg Start: 11/19/20 1000 SCDs Start: 11/19/20 0003 apixaban (ELIQUIS) tablet 2.5 mg    Lab Results  Component Value Date   PLT 153 11/19/2020    Inpatient Medications  Scheduled Meds: . amLODipine  5 mg Oral Daily  . apixaban  2.5 mg Oral BID  . atorvastatin  80 mg Oral QHS  . carvedilol  25 mg Oral BID  . clonazePAM  0.5 mg Oral BID  . dextromethorphan-guaiFENesin  1 tablet Oral BID  . doxycycline  100 mg Oral Q12H  . erythromycin   Right Eye Q6H  . insulin aspart  0-5 Units Subcutaneous QHS  . insulin aspart  0-9 Units Subcutaneous TID WC  . methylPREDNISolone (SOLU-MEDROL) injection  40 mg Intravenous Q8H  . pantoprazole  40 mg Oral Daily  . potassium chloride  10 mEq Oral Daily  . torsemide  60 mg Oral Daily   Continuous Infusions:  PRN Meds:.acetaminophen, guaiFENesin-dextromethorphan    Anti-infectives (From admission, onward)   Start     Dose/Rate Route Frequency Ordered Stop   11/20/20 2200  doxycycline (VIBRA-TABS) tablet 100 mg        100 mg Oral Every 12 hours 11/20/20 1019     11/19/20 2200  cefTRIAXone (ROCEPHIN) injection 1 g  Status:  Discontinued        1 g Intramuscular Every 24 hours 11/19/20 0207 11/19/20 1630   11/19/20 0800  doxycycline (VIBRAMYCIN) 100 mg in sodium chloride 0.9 % 250 mL IVPB  Status:  Discontinued        100 mg 125 mL/hr over 120 Minutes Intravenous Every 12 hours 11/19/20 0207 11/20/20 1018   11/18/20 2130  cefTRIAXone (ROCEPHIN) 2 g in sodium chloride 0.9 % 100 mL IVPB        2 g 200 mL/hr over 30 Minutes Intravenous  Once 11/18/20 2128 11/18/20 2254   11/18/20 2130  azithromycin (ZITHROMAX) 500 mg in sodium chloride 0.9 % 250 mL IVPB  Status:  Discontinued        500 mg 250 mL/hr over 60 Minutes Intravenous Every 24 hours 11/18/20 2128 11/19/20 0207        Objective:   Vitals:   11/19/20 0549 11/19/20 2228 11/20/20 0443 11/20/20  1254  BP: (!) 174/93 (!) 165/66 (!) 158/67 (!) 171/69  Pulse: 73 73 65 73  Resp: 17 18 20 18   Temp: 98.8 F (37.1 C) 98.4 F (36.9 C) 98 F (36.7 C)   TempSrc: Oral Oral Oral   SpO2: 96% 96% 99% 97%  Weight:      Height:        Wt Readings from Last 3 Encounters:  11/18/20 60.8 kg     Intake/Output Summary (Last 24 hours) at 11/20/2020 1624 Last data filed at 11/20/2020 1300 Gross per 24 hour  Intake 600 ml  Output 450 ml  Net 150 ml    Physical Exam  Gen:- Awake Alert, in no acute distress HEENT:- [place].AT, No sclera icterus Neck-Supple Neck,No JVD,.  Lungs-air movement is fair , few scattered wheezes  CV- S1, S2 normal, regular , CABG, left-sided pacer Abd-  +ve B.Sounds, Abd Soft, No tenderness,    Extremity/Skin:- No  edema, pedal pulses present Psych-affect is appropriate, oriented x3 Neuro-generalized weakness, no new focal deficits, no tremors   Data Review:   Micro Results Recent Results (from the past 240 hour(s))  Resp Panel by RT-PCR (Flu A&B, Covid) Nasopharyngeal Swab     Status: None   Collection Time: 11/18/20  5:20 PM   Specimen: Nasopharyngeal Swab; Nasopharyngeal(NP) swabs in vial transport medium  Result Value Ref Range Status   SARS Coronavirus 2 by RT PCR NEGATIVE NEGATIVE Final    Comment: (NOTE) SARS-CoV-2 target nucleic acids are NOT DETECTED.  The SARS-CoV-2 RNA is generally detectable in upper respiratory specimens during the acute phase of infection. The lowest concentration of SARS-CoV-2 viral copies this assay can detect is 138 copies/mL. A negative result does not preclude SARS-Cov-2 infection and should not be used as the sole basis for treatment or other patient management decisions. A negative result may occur with  improper specimen collection/handling, submission of specimen other than nasopharyngeal swab, presence of viral mutation(s) within the areas targeted by this assay, and inadequate number of viral copies(<138 copies/mL).  A negative result must be combined with clinical observations, patient history, and epidemiological information. The expected result is Negative.  Fact Sheet for Patients:  EntrepreneurPulse.com.au  Fact Sheet for Healthcare Providers:  IncredibleEmployment.be  This test is no t yet approved or cleared by the [place] FDA and  has been authorized for detection and/or diagnosis of SARS-CoV-2 by FDA under an Emergency Use Authorization (EUA). This EUA will remain  in effect (meaning this test can be used) for the duration of the COVID-19 declaration under Section 564(b)(1) of the Act, 21 U.S.C.section 360bbb-3(b)(1), unless the authorization is terminated  or revoked sooner.       Influenza A by PCR NEGATIVE NEGATIVE Final   Influenza B by PCR NEGATIVE NEGATIVE Final    Comment: (NOTE) The Xpert Xpress SARS-CoV-2/FLU/RSV plus assay is intended as an aid in the diagnosis of influenza from Nasopharyngeal swab specimens and should not be used as a sole basis for treatment. Nasal washings and aspirates are unacceptable for Xpert Xpress SARS-CoV-2/FLU/RSV testing.  Fact Sheet for Patients: EntrepreneurPulse.com.au  Fact Sheet for Healthcare Providers: IncredibleEmployment.be  This test is not yet approved or cleared by the [place] FDA and has been authorized for detection and/or diagnosis of SARS-CoV-2 by FDA under an Emergency Use Authorization (EUA). This EUA will remain in effect (meaning this test can be used) for the duration of the COVID-19 declaration under Section 564(b)(1) of the Act, 21 U.S.C. section 360bbb-3(b)(1), unless the authorization is terminated or revoked.  Performed at [facility], [address] [place], [place] [address]   Culture, blood (Routine X 2) w Reflex to ID Panel     Status: None (Preliminary result)   Collection Time: 11/19/20  6:41 AM   Specimen: Right  Antecubital; Blood  Result Value Ref Range Status   Specimen Description   Final    RIGHT ANTECUBITAL BOTTLES DRAWN AEROBIC AND ANAEROBIC   Special Requests Blood Culture adequate volume  Final   Culture   Final    NO GROWTH < 24 HOURS Performed at [facility], [address], [place], [place] [address]    Report Status PENDING  Incomplete  Culture, blood (Routine X 2) w Reflex to ID Panel     Status: None (Preliminary result)   Collection Time: 11/19/20  8:49 AM   Specimen: BLOOD RIGHT ARM  Result Value Ref Range Status   Specimen Description   Final    BLOOD RIGHT ARM BOTTLES DRAWN AEROBIC AND ANAEROBIC   Special Requests Blood Culture adequate volume  Final   Culture   Final    NO GROWTH < 24 HOURS Performed at [facility], [address], [place],  [address]    Report Status PENDING  Incomplete    Radiology Reports DG Chest 2 View  Result Date: 11/19/2020 CLINICAL DATA:  Shortness of breath EXAM: CHEST - 2 VIEW COMPARISON:  11/18/2020 FINDINGS: Left pacer remains in place, unchanged. Prior CABG. Cardiomegaly. Bilateral apical scarring. No confluent airspace opacities or effusions. Coarsened interstitial markings again noted, likely chronic changes. IMPRESSION: No acute cardiopulmonary disease or change. Electronically Signed   By: Rolm Baptise M.D.   On: 11/19/2020 14:15   NM Pulmonary Perfusion  Addendum Date: 11/19/2020   ADDENDUM REPORT: 11/19/2020 12:34 ADDENDUM: Study discussed by telephone with Dr. Denton Brick on 11/19/2020 at 1221 hours. He advises that the patient presented already fully anticoagulated on Eliquis, and pointed out that lower extremity Doppler ultrasound today was negative for DVT. Furthermore, he advises the patient is respiratory symptoms have regressed. Therefore, lung perfusion heterogeneity might be related to chronic lung disease rather than acute PE. We discussed attempting to weaned the patient from oxygen by nasal cannula, and repeat chest radiographs  which can be compared to 11/18/2020. Electronically Signed   By: Genevie Ann M.D.   On: 11/19/2020 12:34   Result Date: 11/19/2020 CLINICAL DATA:  81 year old female with abnormal D-dimer. Shortness of breath and chest pain for 1 week. Poor renal function currently (estimated GFR 17). EXAM: NUCLEAR MEDICINE PERFUSION LUNG SCAN TECHNIQUE: Perfusion images were obtained in multiple projections after intravenous injection of radiopharmaceutical. Ventilation scans intentionally deferred if perfusion scan and chest x-ray adequate for interpretation during COVID 19 epidemic. RADIOPHARMACEUTICALS:  4.3 mCi Tc-60m MAA IV COMPARISON:  Portable chest 1739 hours yesterday. FINDINGS: There is asymmetric generally decreased perfusion radiotracer in the left lung compared to the right. And similar somewhat generalized increased radiotracer activity in the right upper lung. But furthermore, on the RPO image there is evidence of a wedge shaped perfusion defect in the area of more robust right lower lung radiotracer activity. IMPRESSION: Perfusion lung scan suspicious for Acute Pulmonary Emboli. Electronically Signed: By: Genevie Ann M.D. On: 11/19/2020 12:16   US Venous Img Lower Bilateral (DVT)  Result Date: 11/19/2020 CLINICAL DATA:  Shortness of breath, cough, chronic kidney disease and elevated D-dimer. Suspicion of possible pulmonary embolism. EXAM: BILATERAL LOWER EXTREMITY VENOUS DOPPLER ULTRASOUND TECHNIQUE: Gray-scale sonography with graded compression, as well as color Doppler and duplex ultrasound were performed to evaluate the lower extremity deep venous systems from the level of the common femoral vein and including the common femoral, femoral, profunda femoral, popliteal and calf veins including the posterior tibial, peroneal and gastrocnemius veins when visible. The superficial great saphenous vein was also interrogated. Spectral Doppler was utilized to evaluate flow at rest and with distal augmentation maneuvers in the  common femoral, femoral and popliteal veins. COMPARISON:  None. FINDINGS: RIGHT LOWER EXTREMITY Common Femoral Vein: No evidence of thrombus. Normal compressibility, respiratory phasicity and response to augmentation. Saphenofemoral Junction:  No evidence of thrombus. Normal compressibility and flow on color Doppler imaging. Profunda Femoral Vein: No evidence of thrombus. Normal compressibility and flow on color Doppler imaging. Femoral Vein: No evidence of thrombus. Normal compressibility, respiratory phasicity and response to augmentation. Popliteal Vein: No evidence of thrombus. Normal compressibility, respiratory phasicity and response to augmentation. Calf Veins: No evidence of thrombus. Normal compressibility and flow on color Doppler imaging. Superficial Great Saphenous Vein: No evidence of thrombus. Normal compressibility. Venous Reflux:  None. Other Findings: No evidence of superficial thrombophlebitis or abnormal fluid collection. LEFT LOWER EXTREMITY Common Femoral Vein: No evidence of thrombus. Normal compressibility, respiratory phasicity and response to augmentation. Saphenofemoral Junction: No evidence of thrombus. Normal compressibility and flow on color Doppler imaging. Profunda Femoral Vein: No evidence of thrombus. Normal compressibility and flow on color Doppler imaging. Femoral Vein: No evidence of thrombus. Normal compressibility, respiratory phasicity and response to augmentation. Popliteal Vein: No evidence of thrombus. Normal compressibility, respiratory phasicity and response to augmentation. Calf Veins: No evidence of thrombus. Normal compressibility and flow on color Doppler imaging. Superficial Great Saphenous Vein: No evidence of thrombus. Normal compressibility. Venous Reflux:  None. Other Findings: No evidence of superficial thrombophlebitis or abnormal fluid collection. IMPRESSION: No evidence of deep venous thrombosis in either lower extremity. Electronically Signed   By: Aletta Edouard M.D.   On: 11/19/2020 11:12   DG Chest Portable 1 View  Result Date: 11/18/2020 CLINICAL DATA:  Shortness of breath EXAM: PORTABLE CHEST 1 VIEW COMPARISON:  None. FINDINGS: Left-sided implanted cardiac device. Post CABG changes. Heart size is upper limits of normal. Densely calcified thoracic aorta. Coarsened interstitial markings bilaterally. No focal airspace consolidation, pleural effusion, or pneumothorax. Degenerative changes of the left glenohumeral joint. IMPRESSION: Chronic appearing bronchitic type lung changes without acute cardiopulmonary findings. Electronically Signed   By: Davina Poke D.O.   On: 11/18/2020 17:56   ECHOCARDIOGRAM COMPLETE  Result Date: 11/19/2020    ECHOCARDIOGRAM REPORT   Patient Name:   Kelly Pierce  Date of Exam: 11/19/2020 Medical Rec #:  [id number]  Height:       62.0 in Accession #:    [id number] Weight:       134.0 lb Date of Birth:  05-13-1941   BSA:          1.612 m Patient Age:    66 years   BP:           174/93 mmHg Patient Gender: F          HR:           71 bpm. Exam Location:  [facility] Procedure: 2D Echo, Cardiac Doppler and Color Doppler Indications:    Elevated troponin  History:        Patient has no prior history of Echocardiogram examinations.                 Stroke; Risk Factors:Hypertension, Diabetes, Dyslipidemia and                 Former Smoker. GERD.  Sonographer:    Vickie Epley RDCS Referring Phys: [id number] Keiko Myricks IMPRESSIONS  1. Left ventricular ejection fraction, by estimation, is 60 to 65%. The left ventricle has normal function. The left ventricle has no regional wall motion abnormalities. There is mild left ventricular hypertrophy. Left ventricular diastolic parameters are consistent with Grade II diastolic dysfunction (pseudonormalization).  2. Right ventricular systolic function is normal. The right ventricular size is normal. There is moderately elevated pulmonary artery  systolic pressure.  3. Left atrial size was moderately  dilated.  4. Right atrial size was mildly dilated.  5. The mitral valve is normal in structure. Trivial mitral valve regurgitation. No evidence of mitral stenosis.  6. The aortic valve is tricuspid. Aortic valve regurgitation is not visualized. Mild aortic valve sclerosis is present, with no evidence of aortic valve stenosis.  7. The inferior vena cava is dilated in size with >50% respiratory variability, suggesting right atrial pressure of 8 mmHg. FINDINGS  Left Ventricle: Left ventricular ejection fraction, by estimation, is 60 to 65%. The left ventricle has normal function. The left ventricle has no regional wall motion abnormalities. The left ventricular internal cavity size was normal in size. There is  mild left ventricular hypertrophy. Left ventricular diastolic parameters are consistent with Grade II diastolic dysfunction (pseudonormalization). Right Ventricle: The right ventricular size is normal.Right ventricular systolic function is normal. There is moderately elevated pulmonary artery systolic pressure. The tricuspid regurgitant velocity is 3.34 m/s, and with an assumed right atrial pressure of 8 mmHg, the estimated right ventricular systolic pressure is 85.2 mmHg. Left Atrium: Left atrial size was moderately dilated. Right Atrium: Right atrial size was mildly dilated. Pericardium: There is no evidence of pericardial effusion. Mitral Valve: The mitral valve is normal in structure. Mild mitral annular calcification. Trivial mitral valve regurgitation. No evidence of mitral valve stenosis. Tricuspid Valve: The tricuspid valve is normal in structure. Tricuspid valve regurgitation is mild . No evidence of tricuspid stenosis. Aortic Valve: The aortic valve is tricuspid. Aortic valve regurgitation is not visualized. Mild aortic valve sclerosis is present, with no evidence of aortic valve stenosis. Pulmonic Valve: The pulmonic valve was normal in structure. Pulmonic valve regurgitation is trivial. No evidence  of pulmonic stenosis. Aorta: The aortic root is normal in size and structure. Venous: The inferior vena cava is dilated in size with greater than 50% respiratory variability, suggesting right atrial pressure of 8 mmHg.  Additional Comments: A pacer wire is visualized.  LEFT VENTRICLE PLAX 2D LVIDd:         4.01 cm      Diastology LVIDs:         3.26 cm      LV e' medial:    3.15 cm/s LV PW:         1.24 cm      LV E/e' medial:  31.7 LV IVS:        1.32 cm      LV e' lateral:   3.98 cm/s LVOT diam:     2.00 cm      LV E/e' lateral: 25.1 LV SV:         52 LV SV Index:   33 LVOT Area:     3.14 cm  LV Volumes (MOD) LV vol d, MOD A2C: 124.0 ml LV vol d, MOD A4C: 84.9 ml LV vol s, MOD A2C: 67.8 ml LV vol s, MOD A4C: 52.3 ml LV SV MOD A2C:     56.2 ml LV SV MOD A4C:     84.9 ml LV SV MOD BP:      49.7 ml RIGHT VENTRICLE RV S prime:     5.43 cm/s TAPSE (M-mode): 1.3 cm LEFT ATRIUM             Index       RIGHT ATRIUM           Index LA diam:        3.90 cm 2.42 cm/m  RA Area:     20.10 cm LA Vol (A2C):   66.7 ml 41.36 ml/m RA Volume:   59.60 ml  36.96 ml/m LA Vol (A4C):   53.4 ml 33.12 ml/m LA Biplane Vol: 66.1 ml 40.99 ml/m  AORTIC VALVE LVOT Vmax:   81.60 cm/s LVOT Vmean:  51.800 cm/s LVOT VTI:    0.167 m  AORTA Ao Root diam: 3.00 cm MITRAL VALVE               TRICUSPID VALVE MV Area (PHT): 2.97 cm    TR Peak grad:   44.6 mmHg MV Decel Time: 255 msec    TR Vmax:        334.00 cm/s MV E velocity: 99.80 cm/s MV A velocity: 76.70 cm/s  SHUNTS MV E/A ratio:  1.30        Systemic VTI:  0.17 m                            Systemic Diam: 2.00 cm Kirk Ruths MD Electronically signed by Kirk Ruths MD Signature Date/Time: 11/19/2020/11:49:39 AM    Final      CBC Recent Labs  Lab 11/18/20 2017 11/19/20 0633  WBC 7.5 5.3  HGB 11.1* 10.3*  HCT 33.9* 33.5*  PLT 168 153  MCV 97.1 98.0  MCH 31.8 30.1  MCHC 32.7 30.7  RDW 13.8 13.8  LYMPHSABS 0.7  --   MONOABS 1.2*  --   EOSABS 0.1  --   BASOSABS 0.0  --      Chemistries  Recent Labs  Lab 11/18/20 2017 11/19/20 0633 11/20/20 0848  NA 135 133* 138  K 3.5 3.4* 3.8  CL 105 107 106  CO2 20* 21* 21*  GLUCOSE 184* 146* 207*  BUN 48* 49* 53*  CREATININE 2.71* 2.69* 2.40*  CALCIUM 8.5* 8.2* 8.8*  MG  --  2.6*  --   AST  --  22  --   ALT  --  22  --   ALKPHOS  --  80  --   BILITOT  --  1.1  --    ------------------------------------------------------------------------------------------------------------------ No results for input(s): CHOL, HDL, LDLCALC, TRIG, CHOLHDL, LDLDIRECT in the last 72 hours.  Lab Results  Component Value Date   HGBA1C 5.8 (H) 11/19/2020   ------------------------------------------------------------------------------------------------------------------ No results for input(s): TSH, T4TOTAL, T3FREE, THYROIDAB in the last 72 hours.  Invalid input(s): FREET3 ------------------------------------------------------------------------------------------------------------------ No results for input(s): VITAMINB12, FOLATE, FERRITIN, TIBC, IRON, RETICCTPCT in the last 72 hours.  Coagulation profile Recent Labs  Lab 11/19/20 0633  INR 1.5*    Recent Labs    11/18/20 2018  DDIMER 2.85*    Cardiac Enzymes No results for input(s): CKMB, TROPONINI, MYOGLOBIN in the last 168 hours.  Invalid input(s): CK ------------------------------------------------------------------------------------------------------------------ No results found for: BNP   Roxan Hockey M.D on 11/20/2020 at 4:24 PM  Go to [email or web address] - for contact info  [facility] - Office  [phone]

## 2020-11-20 NOTE — TOC Initial Note (Signed)
Transition of Care [facility]) - Initial/Assessment Note    Patient Details  Name: Kelly Pierce MRN: [id number] Date of Birth: 25-Dec-1940  Transition of Care [facility]) CM/SW Contact:    Iona Beard, [place] Phone Number: 11/20/2020, 9:18 PM  Clinical Narrative:                 Pt admitted due to acute respiratory failure with hypoxia. Pt is high risk for readmission. Pt states that she lives with her husband. Pt states that she completes ADLs independently. Pt does not drive but her husband is able to provide her transportation. Pt states that she had [facility] in the past, the company was in [place]. Pt states that she has a cane, walker, shower chair, and bsc. TOC to follow for possible d/c needs.   Expected Discharge Plan: Home/Self Care Barriers to Discharge: Continued Medical Work up   Patient Goals and CMS Choice Patient states their goals for this hospitalization and ongoing recovery are:: Return home   Choice offered to / list presented to : NA  Expected Discharge Plan and Services Expected Discharge Plan: Home/Self Care In-house Referral: NA Discharge Planning Services: NA Post Acute Care Choice: NA Living arrangements for the past 2 months: Single Family Home                 DME Arranged: N/A DME Agency: NA       HH Arranged: NA HH Agency: NA        Prior Living Arrangements/Services Living arrangements for the past 2 months: Single Family Home Lives with:: Spouse Patient language and need for interpreter reviewed:: Yes Do you feel safe going back to the place where you live?: Yes      Need for Family Participation in Patient Care: No (Comment) Care giver support system in place?: Yes (comment) Current home services: DME (Cane, walker, shower chair, bsc) Criminal Activity/Legal Involvement Pertinent to Current Situation/Hospitalization: No - Comment as needed  Activities of Daily Living Home Assistive Devices/Equipment: Cane (specify quad or straight) ADL  Screening (condition at time of admission) Patient's cognitive ability adequate to safely complete daily activities?: Yes Is the patient deaf or have difficulty hearing?: No Does the patient have difficulty seeing, even when wearing glasses/contacts?: No Does the patient have difficulty concentrating, remembering, or making decisions?: No Patient able to express need for assistance with ADLs?: Yes Does the patient have difficulty dressing or bathing?: No Independently performs ADLs?: Yes (appropriate for developmental age) Does the patient have difficulty walking or climbing stairs?: No Weakness of Legs: None Weakness of Arms/Hands: None  Permission Sought/Granted                  Emotional Assessment Appearance:: Appears stated age Attitude/Demeanor/Rapport: Engaged Affect (typically observed): Accepting Orientation: : Oriented to Self,Oriented to Place,Oriented to  Time,Oriented to Situation Alcohol / Substance Use: Not Applicable Psych Involvement: No (comment)  Admission diagnosis:  Elevated troponin [R77.8] Acute respiratory failure with hypoxia ([place]) [J96.01] Community acquired pneumonia, unspecified laterality [J18.9] Patient Active Problem List   Diagnosis Date Noted  . Elevated troponin 11/19/2020  . Elevated d-dimer 11/19/2020  . Prolonged QT interval 11/19/2020  . Bacterial conjunctivitis 11/19/2020  . CAP (community acquired pneumonia) 11/19/2020  . Essential hypertension 11/19/2020  . Hypertensive urgency 11/19/2020  . Hyperglycemia due to diabetes mellitus ([place]) 11/19/2020  . Hyperlipidemia associated with type 2 diabetes mellitus ([place]) 11/19/2020  . GERD (gastroesophageal reflux disease) 11/19/2020  . CKD (chronic kidney disease), stage IV ([place])  11/19/2020  . History of CVA (cerebrovascular accident) 11/19/2020  . Atrial fibrillation, chronic ([place]) 11/19/2020  . Acute respiratory failure with hypoxia ([place]) 11/18/2020   PCP:  Quentin Cornwall, MD Pharmacy:    [place], [place] [address] Phone: [phone] Fax: [phone]     Social Determinants of Health (SDOH) Interventions    Readmission Risk Interventions Readmission Risk Prevention Plan 11/20/2020  Transportation Screening Complete  HRI or [place] Complete  Social Work Consult for [place] Planning/Counseling Complete  Palliative Care Screening Not Applicable  Medication Review [employer]) Complete

## 2020-11-21 DIAGNOSIS — J9601 Acute respiratory failure with hypoxia: Secondary | ICD-10-CM | POA: Diagnosis not present

## 2020-11-21 LAB — GLUCOSE, CAPILLARY
Glucose-Capillary: 297 mg/dL — ABNORMAL HIGH (ref 70–99)
Glucose-Capillary: 220 mg/dL — ABNORMAL HIGH (ref 70–99)
Glucose-Capillary: 215 mg/dL — ABNORMAL HIGH (ref 70–99)

## 2020-11-21 MED ORDER — TORSEMIDE 20 MG PO TABS: 60.0000 mg | ORAL_TABLET | Freq: Every day | ORAL | 3 refills | Status: AC

## 2020-11-21 MED ORDER — ACETAMINOPHEN 325 MG PO TABS: 650.0000 mg | ORAL_TABLET | Freq: Four times a day (QID) | ORAL | 0 refills | Status: AC | PRN

## 2020-11-21 MED ORDER — GUAIFENESIN-DM 100-10 MG/5ML PO SYRP: 5.0000 mL | ORAL_SOLUTION | ORAL | 0 refills | Status: AC | PRN

## 2020-11-21 MED ORDER — ISOSORBIDE MONONITRATE ER 60 MG PO TB24
30.0000 mg | ORAL_TABLET | Freq: Once | ORAL | Status: AC
  Administered 2020-11-21: 30 mg via ORAL
  Filled 2020-11-21: qty 1

## 2020-11-21 MED ORDER — ISOSORBIDE MONONITRATE ER 30 MG PO TB24: 30.0000 mg | ORAL_TABLET | Freq: Every day | ORAL | 3 refills | Status: DC

## 2020-11-21 MED ORDER — DOXYCYCLINE HYCLATE 100 MG PO TABS: 100.0000 mg | ORAL_TABLET | Freq: Two times a day (BID) | ORAL | 0 refills | Status: AC

## 2020-11-21 MED ORDER — HYDRALAZINE HCL 25 MG PO TABS
50.0000 mg | ORAL_TABLET | Freq: Once | ORAL | Status: AC
  Administered 2020-11-21: 50 mg via ORAL
  Filled 2020-11-21: qty 2

## 2020-11-21 MED ORDER — BUDESONIDE-FORMOTEROL FUMARATE 160-4.5 MCG/ACT IN AERO: 2.0000 | INHALATION_SPRAY | Freq: Two times a day (BID) | RESPIRATORY_TRACT | 12 refills | Status: AC

## 2020-11-21 MED ORDER — PREDNISONE 20 MG PO TABS: 40.0000 mg | ORAL_TABLET | Freq: Every day | ORAL | 0 refills | Status: AC

## 2020-11-21 MED ORDER — ATORVASTATIN CALCIUM 80 MG PO TABS: 80.0000 mg | ORAL_TABLET | Freq: Every day | ORAL | 5 refills | Status: AC

## 2020-11-21 MED ORDER — AMLODIPINE BESYLATE 5 MG PO TABS
10.0000 mg | ORAL_TABLET | Freq: Every day | ORAL | Status: DC
  Administered 2020-11-21: 10 mg via ORAL
  Filled 2020-11-21: qty 2

## 2020-11-21 MED ORDER — HYDRALAZINE HCL 25 MG PO TABS: 25.0000 mg | ORAL_TABLET | Freq: Two times a day (BID) | ORAL | 2 refills | Status: AC

## 2020-11-21 MED ORDER — AMLODIPINE BESYLATE 10 MG PO TABS: 10.0000 mg | ORAL_TABLET | Freq: Every day | ORAL | 5 refills | Status: AC

## 2020-11-21 MED ORDER — CARVEDILOL 25 MG PO TABS: 25.0000 mg | ORAL_TABLET | Freq: Two times a day (BID) | ORAL | 5 refills | Status: AC

## 2020-11-21 NOTE — Progress Notes (Signed)
Discharge instructions reviewed with patient and patient's husband. Both verbalized understanding of instructions. Patient discharged home with husband in stable condition.   

## 2020-11-21 NOTE — Discharge Summary (Signed)
Kelly Pierce, is a 80 y.o. female  DOB 1941-04-07  MRN [id number].  Admission date:  11/18/2020  Admitting Physician  Bernadette Hoit, DO  Discharge Date:  11/21/2020   Primary MD  Quentin Cornwall, MD  Recommendations for primary care physician for things to follow:   1)Avoid ibuprofen/Advil/Aleve/Motrin/Goody Powders/Naproxen/BC powders/Meloxicam/Diclofenac/Indomethacin and other Nonsteroidal anti-inflammatory medications as these will make you more likely to bleed and can cause stomach ulcers, can also cause Kidney problems.   2)follow up with primary care physician Dr. Vista Deck, Florene Route, MD --- in about 1 week for recheck and reevaluation  3) please take medications as prescribed--- please note that there has been some changes to your medication  Admission Diagnosis  Elevated troponin [R77.8] Acute respiratory failure with hypoxia ([place]) [J96.01] Community acquired pneumonia, unspecified laterality [J18.9]   Discharge Diagnosis  Elevated troponin [R77.8] Acute respiratory failure with hypoxia ([place]) [J96.01] Community acquired pneumonia, unspecified laterality [J18.9]    Principal Problem:   Acute respiratory failure with hypoxia ([place]) Active Problems:   Essential hypertension   CKD (chronic kidney disease), stage IV (HCC)   Atrial fibrillation, chronic (HCC)   Elevated troponin   Elevated d-dimer   Prolonged QT interval   Bacterial conjunctivitis   CAP (community acquired pneumonia)   Hypertensive urgency   Hyperglycemia due to diabetes mellitus ([place])   Hyperlipidemia associated with type 2 diabetes mellitus ([place])   GERD (gastroesophageal reflux disease)   History of CVA (cerebrovascular accident)      Past Medical History:  Diagnosis Date  . Stroke [facility])     Past Surgical History:  Procedure Laterality Date  . CARDIAC SURGERY    . COLON SURGERY    . PACEMAKER INSERTION       HPI  from  the history and physical done on the day of admission:    Chief Complaint: Shortness of breath  HPI: Kelly Pierce is a 80 y.o. female with medical history significant for hypertension, hyperlipidemia, T2 DM, GERD, stroke on Eliquis and CKD stage IV who presents to the emergency department via EMS accompanied by daughter-in-law due to 1 week of shortness of breath, chest congestion and cough with production of occasional greenish phlegm.  Shortness of breath worsens with ambulation and she complained of some chest soreness from frequent coughing. She also complained of right eye redness and discharge which started 2 days ago.  She denies loss of taste or smell, she also denies fever, headache, headache or abdominal pain. She lives at home with her husband and she ambulates with a cane.  ED Course:  In the emergency department, she was intermittently tachypneic, BP was 216/87.  O2 sat was in the high 80s to low 90s in the ED waiting room, so she was placed on supplemental oxygen via an [place] at 2 LPM with improvement in O2 sat.  Work-up in the ED showed normocytic anemia, BUN to creatinine 40/2.71 (no prior labs for comparison).  D-dimer 2.85, troponin I 174.  Respiratory panel for influenza A, B and  SARS coronavirus 2 was negative.  Chest x-ray showed chronic appearing bronchitic type lung changes without acute cardiopulmonary findings. She was empirically treated with IV ceftriaxone and azithromycin, IV hydration was provided. Hospitalist was asked to admit patient for further evaluation and management.     Hospital Course:     BRIEF SUMMARY:- 80 y.o.femalewith medical history significant forhypertension, COPD hyperlipidemia,T2 DM, GERD, stroke on Eliquis and CKD stage IV admitted on 11/18/2020 with acute hypoxic respiratory failure presumably secondary to pneumonia--  A/p 1) acute COPD exacerbation ---  -Overall much improved after treatment with mucolytics bronchodilators and supplemental  oxygen and -iv Solu-Medrol as well as  doxycycline -Hypoxia has resolved, patient has been weaned off O2  -Discharge home on prednisone and doxycycline  2) acute hypoxic respiratory failure--secondary to #1 above, treat as above #1 -was Able to wean off oxygen----resolved --Suspect hypoxia was secondary to COPD exacerbation rather than PE  3) anxiety disorder--decrease clonazepam to 0.5 mg twice daily from 1 mg twice daily due to concerns about hypoxia  4)Chronic Atrial Fibrillation--- stable, continue carvedilol for rate control and Eliquis for secondary stroke prophylaxis  5)DM2-stable, no med changes at this time, continue home insulin regimen   6)Elevated D-Dimer--- VQ scan findings Discussed with radiologist  (favor chronic lung findings over acute PE )and LE Venous Dopplers are Neg -Patient and husband tells me that patient has been compliant with Eliquis -No pleuritic symptoms, hypoxia resolved--- - c/n Eliquis (for A.Fib)  7)HFpEF/Elevated Troponin--- patient with chronic grade 2 diastolic dysfunction CHF --c/n Coreg, EF 60 to 65%, no wall motion maladies, grade 2 diastolic dysfunction noted, patient also has moderate pulmonary hypertension -Continue torsemide  8)Chronic Anemia--- hgb is 10.3 (down from 11.1 on admission)  9)H/o prior stroke--Eliquis as above #4, continue atorvastatin  10)HTN--stable, continue Coreg, add amlodipine  11)CAD/arrhythmias--patient with left-sided pacemaker, status post prior CABG, continue atorvastatin and Coreg -No aspirin, as pt is  already on Eliquis  12)  CKD stage - IV--- renal function appears to be close to baseline,  renally adjust medications, avoid nephrotoxic agents / dehydration  / hypotension   Disposition/--Home with husband    Code Status :  -  Code Status: Full Code   Family Communication:   Discussed with husband,  (patient is alert, awake and coherent)   Consults  :  na  Discharge Condition:  stable  Follow UP   Follow-up Information    Quentin Cornwall, MD. Schedule an appointment as soon as possible for a visit in 1 week(s).   Specialty: Family Medicine Why: copd , HTN and DM follow- up Contact information: [place] [phone]                Diet and Activity recommendation:  As advised  Discharge Instructions    Discharge Instructions    Call MD for:  difficulty breathing, headache or visual disturbances   Complete by: As directed    Call MD for:  persistant dizziness or light-headedness   Complete by: As directed    Call MD for:  persistant nausea and vomiting   Complete by: As directed    Call MD for:  temperature >100.4   Complete by: As directed    Diet - low sodium heart healthy   Complete by: As directed    Diet Carb Modified   Complete by: As directed    Discharge instructions   Complete by: As directed    1)Avoid ibuprofen/Advil/Aleve/Motrin/Goody Powders/Naproxen/BC powders/Meloxicam/Diclofenac/Indomethacin and other Nonsteroidal  anti-inflammatory medications as these will make you more likely to bleed and can cause stomach ulcers, can also cause Kidney problems.   2)follow up with primary care physician Dr. Vista Deck, Florene Route, MD --- in about 1 week for recheck and reevaluation  3) please take medications as prescribed--- please note that there has been some changes to your medication   Increase activity slowly   Complete by: As directed        Discharge Medications     Allergies as of 11/21/2020      Reactions   Amoxicillin-pot Clavulanate Rash   Penicillins Rash      Medication List    TAKE these medications   acetaminophen 325 MG tablet Commonly known as: TYLENOL Take 2 tablets (650 mg total) by mouth every 6 (six) hours as needed for mild pain, moderate pain, fever or headache.   amLODipine 10 MG tablet Commonly known as: NORVASC Take 1 tablet (10 mg total) by mouth daily. For BP Start taking on: November 22, 2020 What changed:   medication strength  how much to take  when to take this  reasons to take this  additional instructions   ascorbic acid 500 MG tablet Commonly known as: VITAMIN C Take 500 mg by mouth daily.   atorvastatin 80 MG tablet Commonly known as: LIPITOR Take 1 tablet (80 mg total) by mouth at bedtime.   budesonide-formoterol 160-4.5 MCG/ACT inhaler Commonly known as: Symbicort Inhale 2 puffs into the lungs 2 (two) times daily.   Calcium Carbonate-Vitamin D 600-200 MG-UNIT Tabs Take 1 tablet by mouth daily.   carvedilol 25 MG tablet Commonly known as: COREG Take 1 tablet (25 mg total) by mouth 2 (two) times daily.   clonazePAM 1 MG tablet Commonly known as: KLONOPIN Take 1 mg by mouth 2 (two) times daily.   doxycycline 100 MG tablet Commonly known as: VIBRA-TABS Take 1 tablet (100 mg total) by mouth 2 (two) times daily for 5 days.   Eliquis 2.5 MG Tabs tablet Generic drug: apixaban Take 2.5 mg by mouth 2 (two) times daily.   guaiFENesin-dextromethorphan 100-10 MG/5ML syrup Commonly known as: ROBITUSSIN DM Take 5 mLs by mouth every 4 (four) hours as needed for cough (chest congestion).   hydrALAZINE 25 MG tablet Commonly known as: APRESOLINE Take 1 tablet (25 mg total) by mouth 2 (two) times daily. For BP   Lantus SoloStar 100 UNIT/ML Solostar Pen Generic drug: insulin glargine Inject 20 Units into the skin at bedtime.   pantoprazole 40 MG tablet Commonly known as: PROTONIX Take 40 mg by mouth daily.   potassium chloride 10 MEQ tablet Commonly known as: KLOR-CON Take 10 mEq by mouth daily.   predniSONE 20 MG tablet Commonly known as: Deltasone Take 2 tablets (40 mg total) by mouth daily with breakfast.   topiramate 50 MG tablet Commonly known as: TOPAMAX Take 50 mg by mouth 2 (two) times daily.   torsemide 20 MG tablet Commonly known as: DEMADEX Take 3 tablets (60 mg total) by mouth daily.   traZODone 100 MG tablet Commonly known  as: DESYREL Take 100 mg by mouth at bedtime.       Major procedures and Radiology Reports - PLEASE review detailed and final reports for all details, in brief -    DG Chest 2 View  Result Date: 11/19/2020 CLINICAL DATA:  Shortness of breath EXAM: CHEST - 2 VIEW COMPARISON:  11/18/2020 FINDINGS: Left pacer remains in place, unchanged. Prior CABG. Cardiomegaly. Bilateral apical scarring.  No confluent airspace opacities or effusions. Coarsened interstitial markings again noted, likely chronic changes. IMPRESSION: No acute cardiopulmonary disease or change. Electronically Signed   By: Rolm Baptise M.D.   On: 11/19/2020 14:15   NM Pulmonary Perfusion  Addendum Date: 11/19/2020   ADDENDUM REPORT: 11/19/2020 12:34 ADDENDUM: Study discussed by telephone with Dr. Denton Brick on 11/19/2020 at 1221 hours. He advises that the patient presented already fully anticoagulated on Eliquis, and pointed out that lower extremity Doppler ultrasound today was negative for DVT. Furthermore, he advises the patient is respiratory symptoms have regressed. Therefore, lung perfusion heterogeneity might be related to chronic lung disease rather than acute PE. We discussed attempting to weaned the patient from oxygen by nasal cannula, and repeat chest radiographs which can be compared to 11/18/2020. Electronically Signed   By: Genevie Ann M.D.   On: 11/19/2020 12:34   Result Date: 11/19/2020 CLINICAL DATA:  80 year old female with abnormal D-dimer. Shortness of breath and chest pain for 1 week. Poor renal function currently (estimated GFR 17). EXAM: NUCLEAR MEDICINE PERFUSION LUNG SCAN TECHNIQUE: Perfusion images were obtained in multiple projections after intravenous injection of radiopharmaceutical. Ventilation scans intentionally deferred if perfusion scan and chest x-ray adequate for interpretation during COVID 19 epidemic. RADIOPHARMACEUTICALS:  4.3 mCi Tc-41m MAA IV COMPARISON:  Portable chest 1739 hours yesterday. FINDINGS: There is  asymmetric generally decreased perfusion radiotracer in the left lung compared to the right. And similar somewhat generalized increased radiotracer activity in the right upper lung. But furthermore, on the RPO image there is evidence of a wedge shaped perfusion defect in the area of more robust right lower lung radiotracer activity. IMPRESSION: Perfusion lung scan suspicious for Acute Pulmonary Emboli. Electronically Signed: By: Genevie Ann M.D. On: 11/19/2020 12:16   US Venous Img Lower Bilateral (DVT)  Result Date: 11/19/2020 CLINICAL DATA:  Shortness of breath, cough, chronic kidney disease and elevated D-dimer. Suspicion of possible pulmonary embolism. EXAM: BILATERAL LOWER EXTREMITY VENOUS DOPPLER ULTRASOUND TECHNIQUE: Gray-scale sonography with graded compression, as well as color Doppler and duplex ultrasound were performed to evaluate the lower extremity deep venous systems from the level of the common femoral vein and including the common femoral, femoral, profunda femoral, popliteal and calf veins including the posterior tibial, peroneal and gastrocnemius veins when visible. The superficial great saphenous vein was also interrogated. Spectral Doppler was utilized to evaluate flow at rest and with distal augmentation maneuvers in the common femoral, femoral and popliteal veins. COMPARISON:  None. FINDINGS: RIGHT LOWER EXTREMITY Common Femoral Vein: No evidence of thrombus. Normal compressibility, respiratory phasicity and response to augmentation. Saphenofemoral Junction: No evidence of thrombus. Normal compressibility and flow on color Doppler imaging. Profunda Femoral Vein: No evidence of thrombus. Normal compressibility and flow on color Doppler imaging. Femoral Vein: No evidence of thrombus. Normal compressibility, respiratory phasicity and response to augmentation. Popliteal Vein: No evidence of thrombus. Normal compressibility, respiratory phasicity and response to augmentation. Calf Veins: No evidence  of thrombus. Normal compressibility and flow on color Doppler imaging. Superficial Great Saphenous Vein: No evidence of thrombus. Normal compressibility. Venous Reflux:  None. Other Findings: No evidence of superficial thrombophlebitis or abnormal fluid collection. LEFT LOWER EXTREMITY Common Femoral Vein: No evidence of thrombus. Normal compressibility, respiratory phasicity and response to augmentation. Saphenofemoral Junction: No evidence of thrombus. Normal compressibility and flow on color Doppler imaging. Profunda Femoral Vein: No evidence of thrombus. Normal compressibility and flow on color Doppler imaging. Femoral Vein: No evidence of thrombus. Normal compressibility, respiratory phasicity and response  to augmentation. Popliteal Vein: No evidence of thrombus. Normal compressibility, respiratory phasicity and response to augmentation. Calf Veins: No evidence of thrombus. Normal compressibility and flow on color Doppler imaging. Superficial Great Saphenous Vein: No evidence of thrombus. Normal compressibility. Venous Reflux:  None. Other Findings: No evidence of superficial thrombophlebitis or abnormal fluid collection. IMPRESSION: No evidence of deep venous thrombosis in either lower extremity. Electronically Signed   By: Aletta Edouard M.D.   On: 11/19/2020 11:12   DG Chest Portable 1 View  Result Date: 11/18/2020 CLINICAL DATA:  Shortness of breath EXAM: PORTABLE CHEST 1 VIEW COMPARISON:  None. FINDINGS: Left-sided implanted cardiac device. Post CABG changes. Heart size is upper limits of normal. Densely calcified thoracic aorta. Coarsened interstitial markings bilaterally. No focal airspace consolidation, pleural effusion, or pneumothorax. Degenerative changes of the left glenohumeral joint. IMPRESSION: Chronic appearing bronchitic type lung changes without acute cardiopulmonary findings. Electronically Signed   By: Davina Poke D.O.   On: 11/18/2020 17:56   ECHOCARDIOGRAM COMPLETE  Result  Date: 11/19/2020    ECHOCARDIOGRAM REPORT   Patient Name:   Gerardine Dooling  Date of Exam: 11/19/2020 Medical Rec #:  [id number]  Height:       62.0 in Accession #:    [id number] Weight:       134.0 lb Date of Birth:  04/08/1941   BSA:          1.612 m Patient Age:    78 years   BP:           174/93 mmHg Patient Gender: F          HR:           71 bpm. Exam Location:  [facility] Procedure: 2D Echo, Cardiac Doppler and Color Doppler Indications:    Elevated troponin  History:        Patient has no prior history of Echocardiogram examinations.                 Stroke; Risk Factors:Hypertension, Diabetes, Dyslipidemia and                 Former Smoker. GERD.  Sonographer:    Vickie Epley RDCS Referring Phys: [id number] Selenne Coggin IMPRESSIONS  1. Left ventricular ejection fraction, by estimation, is 60 to 65%. The left ventricle has normal function. The left ventricle has no regional wall motion abnormalities. There is mild left ventricular hypertrophy. Left ventricular diastolic parameters are consistent with Grade II diastolic dysfunction (pseudonormalization).  2. Right ventricular systolic function is normal. The right ventricular size is normal. There is moderately elevated pulmonary artery systolic pressure.  3. Left atrial size was moderately dilated.  4. Right atrial size was mildly dilated.  5. The mitral valve is normal in structure. Trivial mitral valve regurgitation. No evidence of mitral stenosis.  6. The aortic valve is tricuspid. Aortic valve regurgitation is not visualized. Mild aortic valve sclerosis is present, with no evidence of aortic valve stenosis.  7. The inferior vena cava is dilated in size with >50% respiratory variability, suggesting right atrial pressure of 8 mmHg. FINDINGS  Left Ventricle: Left ventricular ejection fraction, by estimation, is 60 to 65%. The left ventricle has normal function. The left ventricle has no regional wall motion abnormalities. The left ventricular internal cavity size was  normal in size. There is  mild left ventricular hypertrophy. Left ventricular diastolic parameters are consistent with Grade II diastolic dysfunction (pseudonormalization). Right Ventricle: The right ventricular size is normal.Right ventricular systolic function  is normal. There is moderately elevated pulmonary artery systolic pressure. The tricuspid regurgitant velocity is 3.34 m/s, and with an assumed right atrial pressure of 8 mmHg, the estimated right ventricular systolic pressure is 85.8 mmHg. Left Atrium: Left atrial size was moderately dilated. Right Atrium: Right atrial size was mildly dilated. Pericardium: There is no evidence of pericardial effusion. Mitral Valve: The mitral valve is normal in structure. Mild mitral annular calcification. Trivial mitral valve regurgitation. No evidence of mitral valve stenosis. Tricuspid Valve: The tricuspid valve is normal in structure. Tricuspid valve regurgitation is mild . No evidence of tricuspid stenosis. Aortic Valve: The aortic valve is tricuspid. Aortic valve regurgitation is not visualized. Mild aortic valve sclerosis is present, with no evidence of aortic valve stenosis. Pulmonic Valve: The pulmonic valve was normal in structure. Pulmonic valve regurgitation is trivial. No evidence of pulmonic stenosis. Aorta: The aortic root is normal in size and structure. Venous: The inferior vena cava is dilated in size with greater than 50% respiratory variability, suggesting right atrial pressure of 8 mmHg.  Additional Comments: A pacer wire is visualized.  LEFT VENTRICLE PLAX 2D LVIDd:         4.01 cm      Diastology LVIDs:         3.26 cm      LV e' medial:    3.15 cm/s LV PW:         1.24 cm      LV E/e' medial:  31.7 LV IVS:        1.32 cm      LV e' lateral:   3.98 cm/s LVOT diam:     2.00 cm      LV E/e' lateral: 25.1 LV SV:         52 LV SV Index:   33 LVOT Area:     3.14 cm  LV Volumes (MOD) LV vol d, MOD A2C: 124.0 ml LV vol d, MOD A4C: 84.9 ml LV vol s, MOD A2C:  67.8 ml LV vol s, MOD A4C: 52.3 ml LV SV MOD A2C:     56.2 ml LV SV MOD A4C:     84.9 ml LV SV MOD BP:      49.7 ml RIGHT VENTRICLE RV S prime:     5.43 cm/s TAPSE (M-mode): 1.3 cm LEFT ATRIUM             Index       RIGHT ATRIUM           Index LA diam:        3.90 cm 2.42 cm/m  RA Area:     20.10 cm LA Vol (A2C):   66.7 ml 41.36 ml/m RA Volume:   59.60 ml  36.96 ml/m LA Vol (A4C):   53.4 ml 33.12 ml/m LA Biplane Vol: 66.1 ml 40.99 ml/m  AORTIC VALVE LVOT Vmax:   81.60 cm/s LVOT Vmean:  51.800 cm/s LVOT VTI:    0.167 m  AORTA Ao Root diam: 3.00 cm MITRAL VALVE               TRICUSPID VALVE MV Area (PHT): 2.97 cm    TR Peak grad:   44.6 mmHg MV Decel Time: 255 msec    TR Vmax:        334.00 cm/s MV E velocity: 99.80 cm/s MV A velocity: 76.70 cm/s  SHUNTS MV E/A ratio:  1.30        Systemic VTI:  0.17 m  Systemic Diam: 2.00 cm Kirk Ruths MD Electronically signed by Kirk Ruths MD Signature Date/Time: 11/19/2020/11:49:39 AM    Final     Micro Results   Recent Results (from the past 240 hour(s))  Resp Panel by RT-PCR (Flu A&B, Covid) Nasopharyngeal Swab     Status: None   Collection Time: 11/18/20  5:20 PM   Specimen: Nasopharyngeal Swab; Nasopharyngeal(NP) swabs in vial transport medium  Result Value Ref Range Status   SARS Coronavirus 2 by RT PCR NEGATIVE NEGATIVE Final    Comment: (NOTE) SARS-CoV-2 target nucleic acids are NOT DETECTED.  The SARS-CoV-2 RNA is generally detectable in upper respiratory specimens during the acute phase of infection. The lowest concentration of SARS-CoV-2 viral copies this assay can detect is 138 copies/mL. A negative result does not preclude SARS-Cov-2 infection and should not be used as the sole basis for treatment or other patient management decisions. A negative result may occur with  improper specimen collection/handling, submission of specimen other than nasopharyngeal swab, presence of viral mutation(s) within the areas  targeted by this assay, and inadequate number of viral copies(<138 copies/mL). A negative result must be combined with clinical observations, patient history, and epidemiological information. The expected result is Negative.  Fact Sheet for Patients:  EntrepreneurPulse.com.au  Fact Sheet for Healthcare Providers:  IncredibleEmployment.be  This test is no t yet approved or cleared by the [place] FDA and  has been authorized for detection and/or diagnosis of SARS-CoV-2 by FDA under an Emergency Use Authorization (EUA). This EUA will remain  in effect (meaning this test can be used) for the duration of the COVID-19 declaration under Section 564(b)(1) of the Act, 21 U.S.C.section 360bbb-3(b)(1), unless the authorization is terminated  or revoked sooner.       Influenza A by PCR NEGATIVE NEGATIVE Final   Influenza B by PCR NEGATIVE NEGATIVE Final    Comment: (NOTE) The Xpert Xpress SARS-CoV-2/FLU/RSV plus assay is intended as an aid in the diagnosis of influenza from Nasopharyngeal swab specimens and should not be used as a sole basis for treatment. Nasal washings and aspirates are unacceptable for Xpert Xpress SARS-CoV-2/FLU/RSV testing.  Fact Sheet for Patients: EntrepreneurPulse.com.au  Fact Sheet for Healthcare Providers: IncredibleEmployment.be  This test is not yet approved or cleared by the [place] FDA and has been authorized for detection and/or diagnosis of SARS-CoV-2 by FDA under an Emergency Use Authorization (EUA). This EUA will remain in effect (meaning this test can be used) for the duration of the COVID-19 declaration under Section 564(b)(1) of the Act, 21 U.S.C. section 360bbb-3(b)(1), unless the authorization is terminated or revoked.  Performed at [facility], [address] [place], [place] [address]   Culture, blood (Routine X 2) w Reflex to ID Panel     Status: None  (Preliminary result)   Collection Time: 11/19/20  6:41 AM   Specimen: Right Antecubital; Blood  Result Value Ref Range Status   Specimen Description   Final    RIGHT ANTECUBITAL BOTTLES DRAWN AEROBIC AND ANAEROBIC   Special Requests Blood Culture adequate volume  Final   Culture   Final    NO GROWTH < 24 HOURS Performed at [facility], [address] [place], [place] [address]    Report Status PENDING  Incomplete  Culture, blood (Routine X 2) w Reflex to ID Panel     Status: None (Preliminary result)   Collection Time: 11/19/20  8:49 AM   Specimen: BLOOD RIGHT ARM  Result Value Ref Range Status  Specimen Description   Final    BLOOD RIGHT ARM BOTTLES DRAWN AEROBIC AND ANAEROBIC   Special Requests Blood Culture adequate volume  Final   Culture   Final    NO GROWTH < 24 HOURS Performed at [facility], [address], [place], [place] [address]    Report Status PENDING  Incomplete       Today   Subjective    Zemira Zehring today has no new complaints, No fever  Or chills   No Nausea, Vomiting or Diarrhea --- No chest pain, no further hypoxia             Patient has been seen and examined prior to discharge   Objective   Blood pressure (!) 140/57, pulse 70, temperature 98.5 F (36.9 C), temperature source Oral, resp. rate 17, height 5\' 2"  (1.575 m), weight 60.8 kg, SpO2 98 %.   Intake/Output Summary (Last 24 hours) at 11/21/2020 1317 Last data filed at 11/21/2020 1228 Gross per 24 hour  Intake 480 ml  Output 700 ml  Net -220 ml    Exam Gen:- Awake Alert, no acute distress  HEENT:- .AT, No sclera icterus Neck-Supple Neck,No JVD,.  Lungs-improved air movement, no wheezing CV- S1, S2 normal, irregular, prior CABG scar, left-sided pacemaker in situ Abd-  +ve B.Sounds, Abd Soft, No tenderness,    Extremity/Skin:- No  edema,   good pulses Psych-affect is appropriate, oriented x3 Neuro-no new focal deficits, no tremors    Data Review   CBC w Diff:  Lab Results   Component Value Date   WBC 5.3 11/19/2020   HGB 10.3 (L) 11/19/2020   HCT 33.5 (L) 11/19/2020   PLT 153 11/19/2020   LYMPHOPCT 9 11/18/2020   MONOPCT 16 11/18/2020   EOSPCT 1 11/18/2020   BASOPCT 0 11/18/2020    CMP:  Lab Results  Component Value Date   NA 138 11/20/2020   K 3.8 11/20/2020   CL 106 11/20/2020   CO2 21 (L) 11/20/2020   BUN 53 (H) 11/20/2020   CREATININE 2.40 (H) 11/20/2020   PROT 6.6 11/19/2020   ALBUMIN 2.7 (L) 11/19/2020   BILITOT 1.1 11/19/2020   ALKPHOS 80 11/19/2020   AST 22 11/19/2020   ALT 22 11/19/2020  .   Total Discharge time is about 33 minutes  Roxan Hockey M.D on 11/21/2020 at 1:17 PM  Go to [email or web address] -  for contact info  [facility] - Office  [phone]

## 2020-11-21 NOTE — Discharge Instructions (Signed)
1)Avoid ibuprofen/Advil/Aleve/Motrin/Goody Powders/Naproxen/BC powders/Meloxicam/Diclofenac/Indomethacin and other Nonsteroidal anti-inflammatory medications as these will make you more likely to bleed and can cause stomach ulcers, can also cause Kidney problems.   2)follow up with primary care physician Dr. Vista Deck, Florene Route, MD --- in about 1 week for recheck and reevaluation  3) please take medications as prescribed--- please note that there has been some changes to your medication

## 2020-11-21 NOTE — Care Management Important Message (Signed)
Important Message  Patient Details  Name: Kelly Pierce MRN: [id number] Date of Birth: 1941/07/19   Medicare Important Message Given:  Yes     Tommy Medal 11/21/2020, 12:15 PM

## 2020-11-24 LAB — CULTURE, BLOOD (ROUTINE X 2)
Culture: NO GROWTH
Culture: NO GROWTH
Special Requests: ADEQUATE
Special Requests: ADEQUATE

## 2021-02-13 ENCOUNTER — Emergency Department (HOSPITAL_COMMUNITY): Payer: Medicare Other

## 2021-02-13 ENCOUNTER — Emergency Department (HOSPITAL_COMMUNITY)
Admission: EM | Admit: 2021-02-13 | Discharge: 2021-02-13 | Disposition: A | Discharge status: Home / Self Care | Payer: Medicare Other | Attending: Emergency Medicine | Admitting: Emergency Medicine

## 2021-02-13 ENCOUNTER — Encounter (HOSPITAL_COMMUNITY): Payer: Self-pay | Admitting: *Deleted

## 2021-02-13 DIAGNOSIS — Z794 Long term (current) use of insulin: Secondary | ICD-10-CM | POA: Insufficient documentation

## 2021-02-13 DIAGNOSIS — I130 Hypertensive heart and chronic kidney disease with heart failure and stage 1 through stage 4 chronic kidney disease, or unspecified chronic kidney disease: Secondary | ICD-10-CM | POA: Diagnosis not present

## 2021-02-13 DIAGNOSIS — Z79899 Other long term (current) drug therapy: Secondary | ICD-10-CM | POA: Insufficient documentation

## 2021-02-13 DIAGNOSIS — Z87891 Personal history of nicotine dependence: Secondary | ICD-10-CM | POA: Insufficient documentation

## 2021-02-13 DIAGNOSIS — E1122 Type 2 diabetes mellitus with diabetic chronic kidney disease: Secondary | ICD-10-CM | POA: Insufficient documentation

## 2021-02-13 DIAGNOSIS — I4891 Unspecified atrial fibrillation: Secondary | ICD-10-CM | POA: Diagnosis not present

## 2021-02-13 DIAGNOSIS — N184 Chronic kidney disease, stage 4 (severe): Secondary | ICD-10-CM | POA: Diagnosis not present

## 2021-02-13 DIAGNOSIS — M7989 Other specified soft tissue disorders: Secondary | ICD-10-CM | POA: Insufficient documentation

## 2021-02-13 DIAGNOSIS — R609 Edema, unspecified: Secondary | ICD-10-CM

## 2021-02-13 DIAGNOSIS — Z7901 Long term (current) use of anticoagulants: Secondary | ICD-10-CM | POA: Insufficient documentation

## 2021-02-13 DIAGNOSIS — I509 Heart failure, unspecified: Secondary | ICD-10-CM | POA: Insufficient documentation

## 2021-02-13 DIAGNOSIS — Z950 Presence of cardiac pacemaker: Secondary | ICD-10-CM | POA: Insufficient documentation

## 2021-02-13 LAB — BASIC METABOLIC PANEL
Anion gap: 9 (ref 5–15)
BUN: 27 mg/dL — ABNORMAL HIGH (ref 8–23)
CO2: 20 mmol/L — ABNORMAL LOW (ref 22–32)
Calcium: 8.9 mg/dL (ref 8.9–10.3)
Chloride: 107 mmol/L (ref 98–111)
Creatinine, Ser: 2.93 mg/dL — ABNORMAL HIGH (ref 0.44–1.00)
GFR, Estimated: 16 mL/min — ABNORMAL LOW (ref >60)
Glucose, Bld: 222 mg/dL — ABNORMAL HIGH (ref 70–99)
Potassium: 3.2 mmol/L — ABNORMAL LOW (ref 3.5–5.1)
Sodium: 136 mmol/L (ref 135–145)

## 2021-02-13 LAB — CBC WITH DIFFERENTIAL/PLATELET
Abs Immature Granulocytes: 0.05 10*3/uL (ref 0.00–0.07)
Basophils Absolute: 0 10*3/uL (ref 0.0–0.1)
Basophils Relative: 1 %
Eosinophils Absolute: 0.2 10*3/uL (ref 0.0–0.5)
Eosinophils Relative: 5 %
HCT: 36.8 % (ref 36.0–46.0)
Hemoglobin: 11.9 g/dL — ABNORMAL LOW (ref 12.0–15.0)
Immature Granulocytes: 1 %
Lymphocytes Relative: 24 %
Lymphs Abs: 1.1 10*3/uL (ref 0.7–4.0)
MCH: 29.7 pg (ref 26.0–34.0)
MCHC: 32.3 g/dL (ref 30.0–36.0)
MCV: 91.8 fL (ref 80.0–100.0)
Monocytes Absolute: 0.6 10*3/uL (ref 0.1–1.0)
Monocytes Relative: 13 %
Neutro Abs: 2.5 10*3/uL (ref 1.7–7.7)
Neutrophils Relative %: 56 %
Platelets: 150 10*3/uL (ref 150–400)
RBC: 4.01 MIL/uL (ref 3.87–5.11)
RDW: 14.2 % (ref 11.5–15.5)
WBC: 4.4 10*3/uL (ref 4.0–10.5)
nRBC: 0 % (ref 0.0–0.2)

## 2021-02-13 LAB — BRAIN NATRIURETIC PEPTIDE: B Natriuretic Peptide: 752 pg/mL — ABNORMAL HIGH (ref 0.0–100.0)

## 2021-02-13 NOTE — ED Provider Notes (Signed)
[facility] EMERGENCY DEPARTMENT Provider Note   CSN: [id number] Arrival date & time: 02/13/21  1328     History Chief Complaint  Patient presents with  . Leg Pain    Kelly Pierce is a 80 y.o. female.  HPI      Kelly Pierce is a 80 y.o. female with past medical history of hypertension, CKD, diabetes, and chronic atrial fibrillation who presents to the Emergency Department complaining of pain and swelling of the bilateral lower legs.  Symptoms have been present for 3 to 4 weeks.  Gradually worsening.  She describes aching pain to her lower legs that radiates into both feet.  Pain worse upon standing.  She was seen at an urgent care facility in [facility] and started on an unknown antibiotic without improvement.  Reports history of CHF does currently take a diuretic.  She is anticoagulated with Eliquis for her atrial fibrillation.  She denies any chest pain, shortness of breath, fever, chills, cough or abdominal pain.  No history of PE or DVT.    Past Medical History:  Diagnosis Date  . Stroke [facility])     Patient Active Problem List   Diagnosis Date Noted  . Elevated troponin 11/19/2020  . Elevated d-dimer 11/19/2020  . Prolonged QT interval 11/19/2020  . Bacterial conjunctivitis 11/19/2020  . CAP (community acquired pneumonia) 11/19/2020  . Essential hypertension 11/19/2020  . Hypertensive urgency 11/19/2020  . Hyperglycemia due to diabetes mellitus ([place]) 11/19/2020  . Hyperlipidemia associated with type 2 diabetes mellitus ([place]) 11/19/2020  . GERD (gastroesophageal reflux disease) 11/19/2020  . CKD (chronic kidney disease), stage IV ([place]) 11/19/2020  . History of CVA (cerebrovascular accident) 11/19/2020  . Atrial fibrillation, chronic ([place]) 11/19/2020  . Acute respiratory failure with hypoxia ([place]) 11/18/2020    Past Surgical History:  Procedure Laterality Date  . CARDIAC SURGERY    . COLON SURGERY    . PACEMAKER INSERTION       OB History   No obstetric  history on file.     No family history on file.  Social History   Tobacco Use  . Smoking status: Former Research scientist (life sciences)  . Smokeless tobacco: Former Network engineer Use Topics  . Alcohol use: Not Currently  . Drug use: Never    Home Medications Prior to Admission medications   Medication Sig Start Date End Date Taking? Authorizing Provider  acetaminophen (TYLENOL) 325 MG tablet Take 2 tablets (650 mg total) by mouth every 6 (six) hours as needed for mild pain, moderate pain, fever or headache. 11/21/20   Roxan Hockey, MD  amLODipine (NORVASC) 10 MG tablet Take 1 tablet (10 mg total) by mouth daily. For BP 11/22/20   Emokpae, Courage, MD  ascorbic acid (VITAMIN C) 500 MG tablet Take 500 mg by mouth daily.    [provider]  atorvastatin (LIPITOR) 80 MG tablet Take 1 tablet (80 mg total) by mouth at bedtime. 11/21/20   Roxan Hockey, MD  budesonide-formoterol (SYMBICORT) 160-4.5 MCG/ACT inhaler Inhale 2 puffs into the lungs 2 (two) times daily. 11/21/20   Roxan Hockey, MD  Calcium Carbonate-Vitamin D 600-200 MG-UNIT TABS Take 1 tablet by mouth daily.    [provider]  carvedilol (COREG) 25 MG tablet Take 1 tablet (25 mg total) by mouth 2 (two) times daily. 11/21/20   Roxan Hockey, MD  clonazePAM (KLONOPIN) 1 MG tablet Take 1 mg by mouth 2 (two) times daily. 10/27/20   [provider]  ELIQUIS 2.5 MG TABS tablet Take  2.5 mg by mouth 2 (two) times daily. 10/25/20   [provider]  guaiFENesin-dextromethorphan (ROBITUSSIN DM) 100-10 MG/5ML syrup Take 5 mLs by mouth every 4 (four) hours as needed for cough (chest congestion). 11/21/20   Roxan Hockey, MD  hydrALAZINE (APRESOLINE) 25 MG tablet Take 1 tablet (25 mg total) by mouth 2 (two) times daily. For BP 11/21/20   Emokpae, Courage, MD  insulin glargine (LANTUS SOLOSTAR) 100 UNIT/ML Solostar Pen Inject 20 Units into the skin at bedtime.    [provider]  pantoprazole (PROTONIX) 40 MG tablet Take  40 mg by mouth daily. 10/25/20   [provider]  potassium chloride (KLOR-CON) 10 MEQ tablet Take 10 mEq by mouth daily. 11/03/20   [provider]  predniSONE (DELTASONE) 20 MG tablet Take 2 tablets (40 mg total) by mouth daily with breakfast. 11/21/20   Roxan Hockey, MD  topiramate (TOPAMAX) 50 MG tablet Take 50 mg by mouth 2 (two) times daily. 10/25/20   [provider]  torsemide (DEMADEX) 20 MG tablet Take 3 tablets (60 mg total) by mouth daily. 11/21/20   Roxan Hockey, MD  traZODone (DESYREL) 100 MG tablet Take 100 mg by mouth at bedtime. 11/02/20   [provider]    Allergies    Amoxicillin-pot clavulanate and Penicillins  Review of Systems   Review of Systems  Constitutional: Negative for chills, fatigue and fever.  Respiratory: Negative for cough, chest tightness, shortness of breath and wheezing.   Cardiovascular: Positive for leg swelling. Negative for chest pain and palpitations.  Gastrointestinal: Negative for abdominal pain, nausea and vomiting.  Genitourinary: Negative for decreased urine volume, dysuria and flank pain.  Musculoskeletal: Negative for arthralgias, back pain and myalgias.  Skin: Negative for rash.  Neurological: Negative for dizziness, syncope, weakness, numbness and headaches.  Hematological: Does not bruise/bleed easily.  Psychiatric/Behavioral: Negative for confusion.    Physical Exam Updated Vital Signs BP (!) 157/75   Pulse 70   Temp (!) 97.3 F (36.3 C) (Oral)   Resp 20   SpO2 98%   Physical Exam Vitals and nursing note reviewed.  Constitutional:      General: She is not in acute distress.    Appearance: Normal appearance. She is not toxic-appearing.  HENT:     Head: Atraumatic.  Cardiovascular:     Rate and Rhythm: Normal rate and regular rhythm.     Pulses: Normal pulses.  Pulmonary:     Effort: Pulmonary effort is normal. No respiratory distress.     Breath sounds: Normal breath sounds.   Abdominal:     General: There is no distension.     Palpations: Abdomen is soft.     Tenderness: There is no abdominal tenderness.  Musculoskeletal:        General: Swelling and tenderness present. No signs of injury.     Cervical back: Normal range of motion.     Right lower leg: Edema present.     Left lower leg: Edema present.     Comments: Tenderness and pitting edema of the bilateral lower extremities, right greater than left.  Mild erythema noted to the right lower leg.  Skin of the bilateral lower extremities have shiny appearance.  Skin:    General: Skin is warm.     Capillary Refill: Capillary refill takes less than 2 seconds.     Findings: No rash.  Neurological:     General: No focal deficit present.     Mental Status: She is alert.  Sensory: No sensory deficit.     Motor: No weakness.     ED Results / Procedures / Treatments   Labs (all labs ordered are listed, but only abnormal results are displayed) Labs Reviewed  BASIC METABOLIC PANEL - Abnormal; Notable for the following components:      Result Value   Potassium 3.2 (*)    CO2 20 (*)    Glucose, Bld 222 (*)    BUN 27 (*)    Creatinine, Ser 2.93 (*)    GFR, Estimated 16 (*)    All other components within normal limits  CBC WITH DIFFERENTIAL/PLATELET - Abnormal; Notable for the following components:   Hemoglobin 11.9 (*)    All other components within normal limits  BRAIN NATRIURETIC PEPTIDE - Abnormal; Notable for the following components:   B Natriuretic Peptide 752.0 (*)    All other components within normal limits    EKG EKG Interpretation  Date/Time:  Tuesday February 13 2021 15:39:25 EDT Ventricular Rate:  71 PR Interval:  153 QRS Duration: 162 QT Interval:  497 QTC Calculation: 541 R Axis:   -71 Text Interpretation: Sinus rhythm Left bundle branch block No significant change since last tracing Confirmed by Blanchie Dessert [phone]) on 02/14/2021 9:06:57 PM   Radiology DG Chest 1  View  Result Date: 02/13/2021 CLINICAL DATA:  Bilateral leg pain and swelling EXAM: CHEST  1 VIEW COMPARISON:  11/19/2020 FINDINGS: Bilateral mild interstitial thickening. No focal consolidation. No pleural effusion or pneumothorax. Stable cardiomegaly. Prior CABG. 2 lead cardiac pacemaker. No acute osseous abnormality. IMPRESSION: 1. Cardiomegaly with mild pulmonary vascular congestion. Electronically Signed   By: Kathreen Devoid   On: 02/13/2021 17:01   US Venous Img Lower Bilateral  Result Date: 02/13/2021 CLINICAL DATA:  Bilateral lower extremity edema. EXAM: BILATERAL LOWER EXTREMITY VENOUS DOPPLER ULTRASOUND TECHNIQUE: Gray-scale sonography with graded compression, as well as color Doppler and duplex ultrasound were performed to evaluate the lower extremity deep venous systems from the level of the common femoral vein and including the common femoral, femoral, profunda femoral, popliteal and calf veins including the posterior tibial, peroneal and gastrocnemius veins when visible. The superficial great saphenous vein was also interrogated. Spectral Doppler was utilized to evaluate flow at rest and with distal augmentation maneuvers in the common femoral, femoral and popliteal veins. COMPARISON:  None. FINDINGS: RIGHT LOWER EXTREMITY Common Femoral Vein: No evidence of thrombus. Normal compressibility, respiratory phasicity and response to augmentation. Saphenofemoral Junction: No evidence of thrombus. Normal compressibility and flow on color Doppler imaging. Profunda Femoral Vein: No evidence of thrombus. Normal compressibility and flow on color Doppler imaging. Femoral Vein: No evidence of thrombus. Normal compressibility, respiratory phasicity and response to augmentation. Popliteal Vein: No evidence of thrombus. Normal compressibility, respiratory phasicity and response to augmentation. Calf Veins: No evidence of thrombus. Normal compressibility and flow on color Doppler imaging. Superficial Great  Saphenous Vein: No evidence of thrombus. Normal compressibility. Venous Reflux:  None. Other Findings: No evidence of superficial thrombophlebitis or abnormal fluid collection. LEFT LOWER EXTREMITY Common Femoral Vein: No evidence of thrombus. Normal compressibility, respiratory phasicity and response to augmentation. Saphenofemoral Junction: No evidence of thrombus. Normal compressibility and flow on color Doppler imaging. Profunda Femoral Vein: No evidence of thrombus. Normal compressibility and flow on color Doppler imaging. Femoral Vein: No evidence of thrombus. Normal compressibility, respiratory phasicity and response to augmentation. Popliteal Vein: No evidence of thrombus. Normal compressibility, respiratory phasicity and response to augmentation. Calf Veins: No evidence of thrombus. Normal compressibility  and flow on color Doppler imaging. Superficial Great Saphenous Vein: No evidence of thrombus. Normal compressibility. Venous Reflux:  None. Other Findings: No evidence of superficial thrombophlebitis or abnormal fluid collection. IMPRESSION: No evidence of deep venous thrombosis in either lower extremity. Electronically Signed   By: Aletta Edouard M.D.   On: 02/13/2021 16:32    Procedures Procedures   Medications Ordered in ED Medications - No data to display  ED Course  I have reviewed the triage vital signs and the nursing notes.  Pertinent labs & imaging results that were available during my care of the patient were reviewed by me and considered in my medical decision making (see chart for details).    MDM Rules/Calculators/A&P                          Patient here with pain and edema of the bilateral lower extremities.  No cough, chest pain or shortness of breath.  She is well-appearing.  No history of PE or DVT.  She is currently anticoagulated for her atrial fibrillation.  Clinically, I suspect this is related to peripheral edema versus early CHF.  Will obtain labs, EKG, x-ray and  ultrasound to rule out DVT.  On recheck, patient resting comfortably.  Chest x-ray shows some mild pulmonary vascular congestion and BNP is mildly elevated at 752.  Venous imaging of the bilateral lower extremities is negative for evidence of DVT.  Sx's mostly likely related to peripheral edema.  Currently taking torsemide 20 mg 3 times daily.  I will temporarily change her dose to 2 pills twice daily.  She is overall well-appearing, I feel she is appropriate for discharge home agrees to elevate her legs when possible and close outpatient follow-up with PCP this week.  Strict return precautions were also discussed and pt and family agrees to care plan  The patient appears reasonably screened and/or stabilized for discharge and I doubt any other medical condition or other [facility] requiring further screening, evaluation, or treatment in the ED at this time prior to discharge.   Final Clinical Impression(s) / ED Diagnoses Final diagnoses:  Peripheral edema    Rx / DC Orders ED Discharge Orders    None       Kem Parkinson, PA-C 02/15/21 1457    Milton Ferguson, MD 02/15/21 2239

## 2021-02-13 NOTE — Discharge Instructions (Addendum)
As discussed, your ultrasound today did not show evidence of a blood clot in either leg.  The pain and swelling of your legs are likely related to fluid overload.  Take your torsemide, 2 tablets twice daily for 3 days.  Elevate your legs when possible.  It is important that you follow-up with your primary care provider later this week for recheck.  Return to the emergency department if you develop any worsening symptoms such as chest pain, and/or shortness of breath

## 2021-02-13 NOTE — ED Triage Notes (Signed)
Bilateral feet and leg swelling for 3 weeks

## 2021-04-18 DEATH — deceased

## 2021-11-01 IMAGING — US US EXTREM LOW VENOUS
1 series · 13 of 24 positions shown · non-contrast
Comparison: None.

CLINICAL DATA: Shortness of breath, cough, chronic kidney disease
and elevated D-dimer. Suspicion of possible pulmonary embolism.



[Series 1: us venous img lower bilat (dvt) · portal-venous · 13 of 63 slices shown]
[im 1/63]
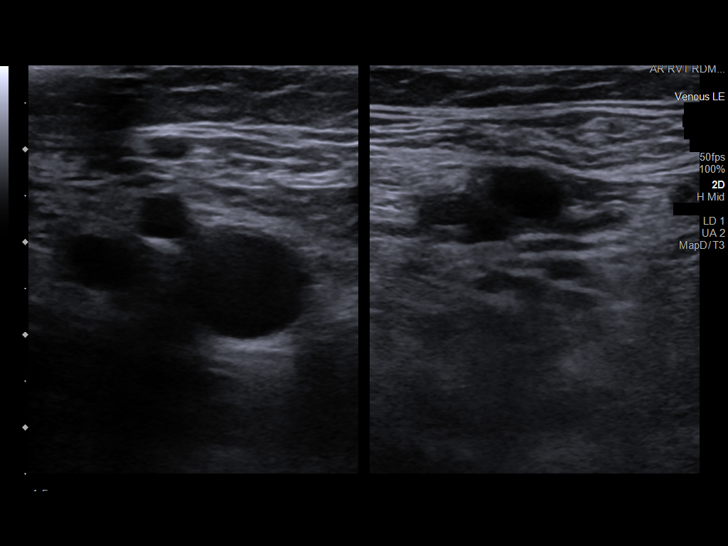
[im 6/63]
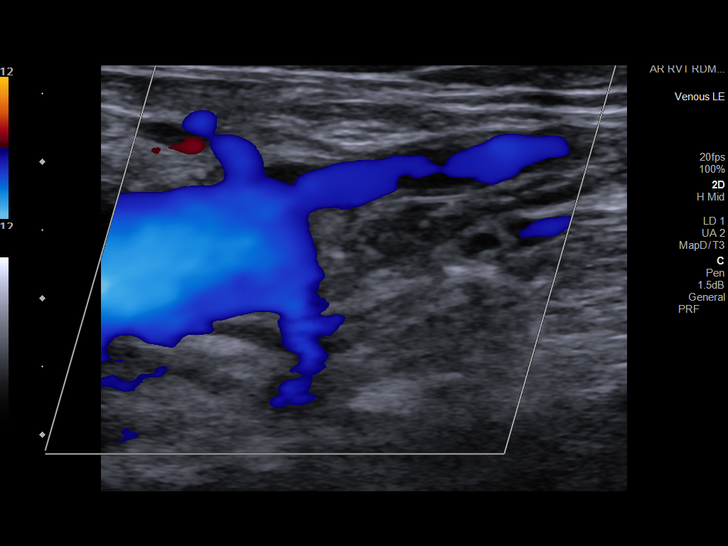
[im 11/63]
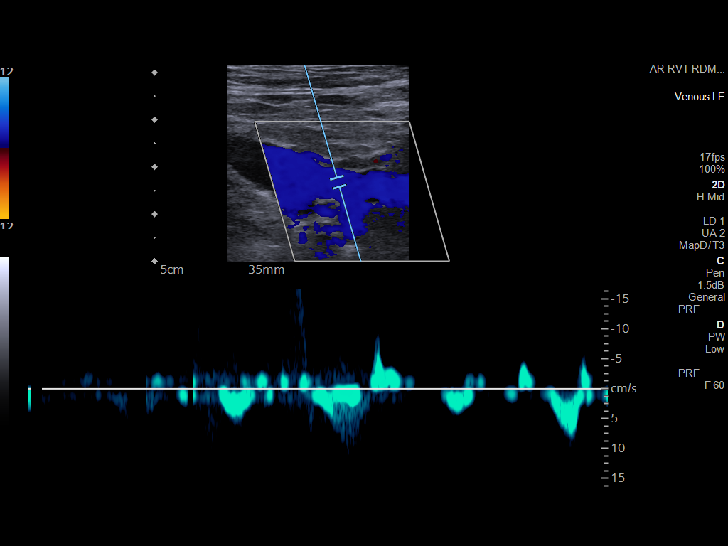
[im 17/63]
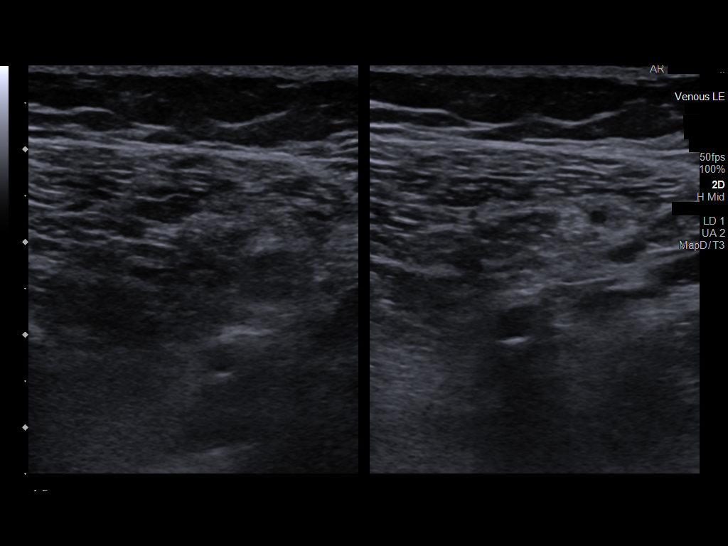
[im 22/63]
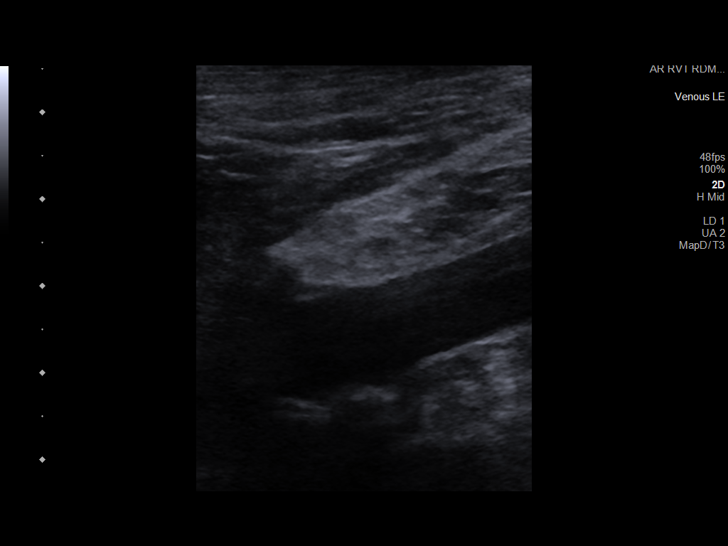
[im 27/63]
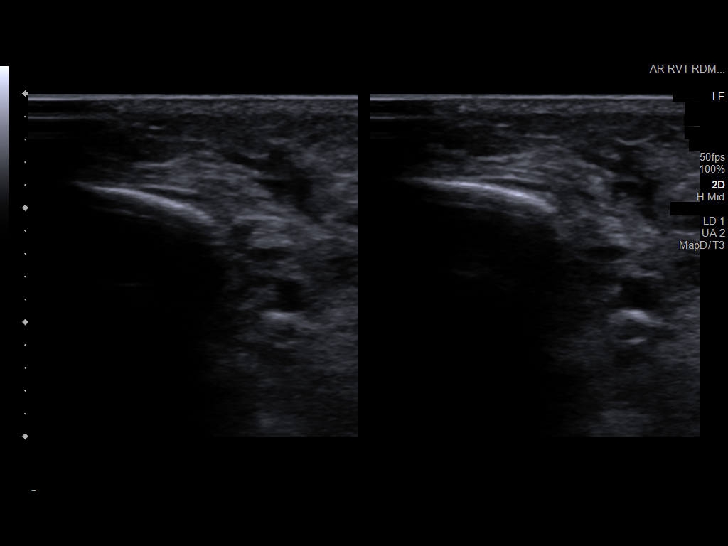
[im 33/63]
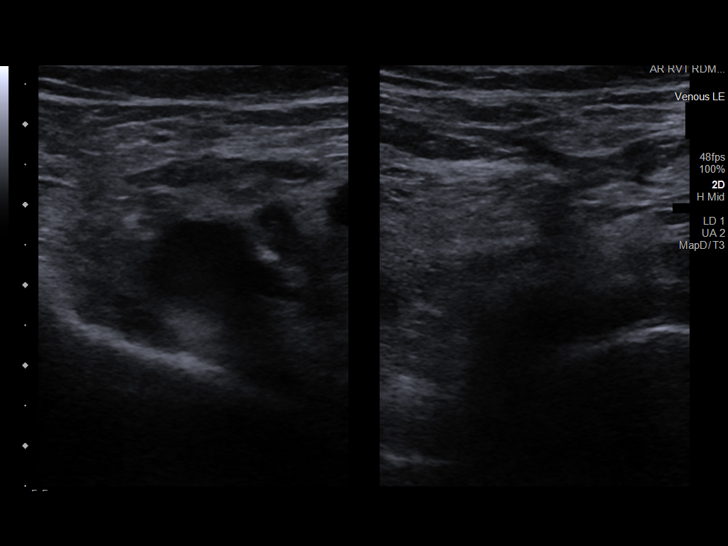
[im 36/63]
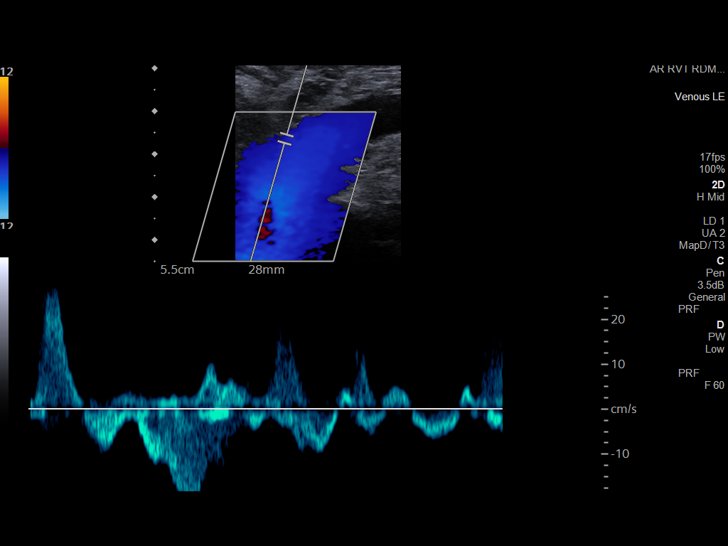
[im 41/63]
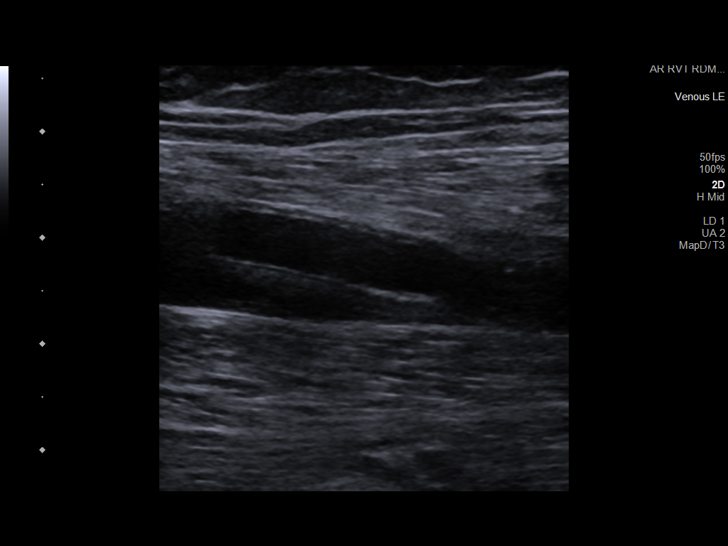
[im 46/63]
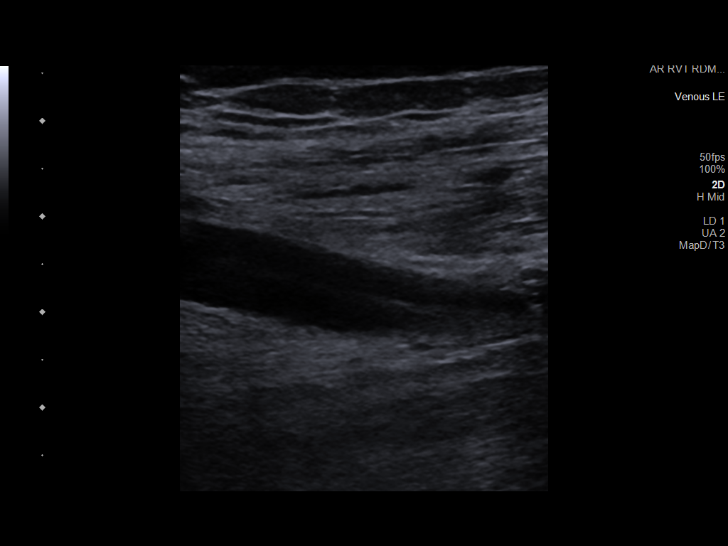
[im 52/63]
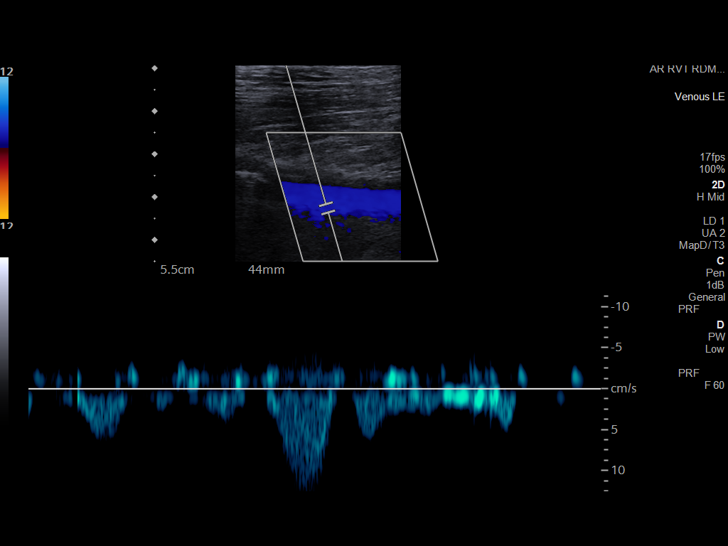
[im 57/63]
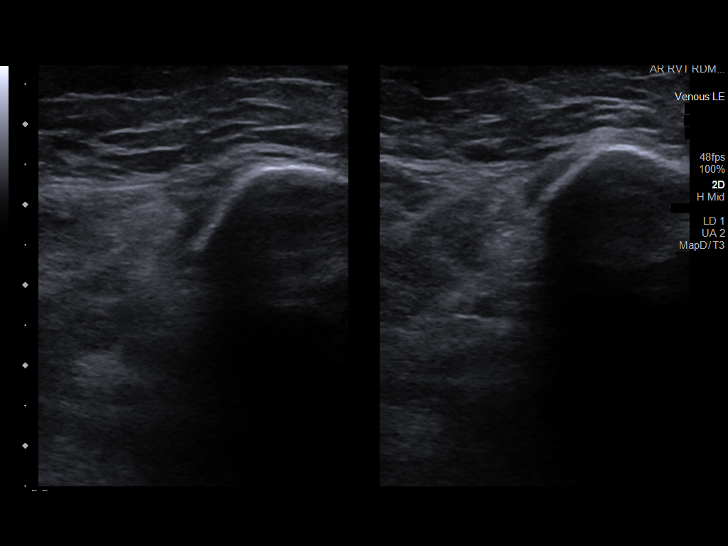
[im 63/63]
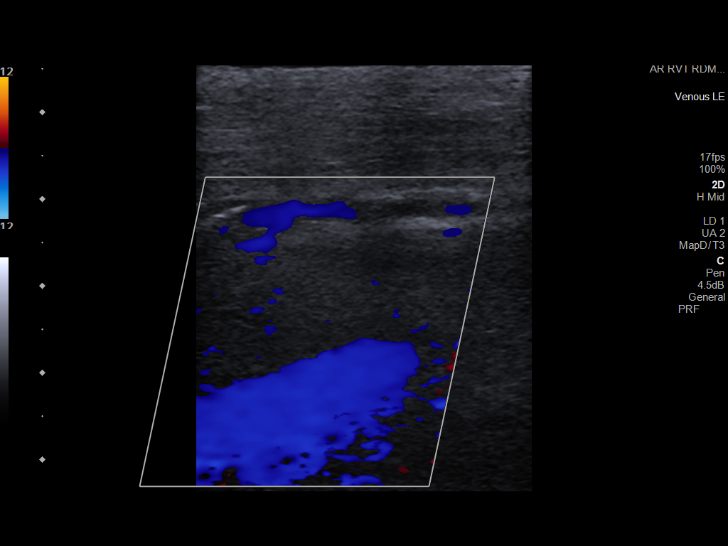

[13 of 24 positions shown; findings below may reference images not displayed]

FINDINGS: RIGHT LOWER EXTREMITY

Common Femoral Vein: No evidence of thrombus. Normal
compressibility, respiratory phasicity and response to augmentation.

Saphenofemoral Junction: No evidence of thrombus. Normal
compressibility and flow on color Doppler imaging.

Profunda Femoral Vein: No evidence of thrombus. Normal
compressibility and flow on color Doppler imaging.

Femoral Vein: No evidence of thrombus. Normal compressibility,
respiratory phasicity and response to augmentation.

Popliteal Vein: No evidence of thrombus. Normal compressibility,
respiratory phasicity and response to augmentation.

Calf Veins: No evidence of thrombus. Normal compressibility and flow
on color Doppler imaging.

Superficial Great Saphenous Vein: No evidence of thrombus. Normal
compressibility.

Venous Reflux:  None.

Other Findings: No evidence of superficial thrombophlebitis or
abnormal fluid collection.

LEFT LOWER EXTREMITY

Common Femoral Vein: No evidence of thrombus. Normal
compressibility, respiratory phasicity and response to augmentation.

Saphenofemoral Junction: No evidence of thrombus. Normal
compressibility and flow on color Doppler imaging.

Profunda Femoral Vein: No evidence of thrombus. Normal
compressibility and flow on color Doppler imaging.

Femoral Vein: No evidence of thrombus. Normal compressibility,
respiratory phasicity and response to augmentation.

Popliteal Vein: No evidence of thrombus. Normal compressibility,
respiratory phasicity and response to augmentation.

Calf Veins: No evidence of thrombus. Normal compressibility and flow
on color Doppler imaging.

Superficial Great Saphenous Vein: No evidence of thrombus. Normal
compressibility.

Venous Reflux:  None.

Other Findings: No evidence of superficial thrombophlebitis or
abnormal fluid collection.
IMPRESSION: No evidence of deep venous thrombosis in either lower extremity.

## 2022-01-26 IMAGING — US US EXTREM LOW VENOUS
1 series · 13 of 24 positions shown · non-contrast
Comparison: None.

CLINICAL DATA: Bilateral lower extremity edema.



[Series 1: us venous img lower bilat (dvt) · portal-venous · 13 of 61 slices shown]
[im 1/61]
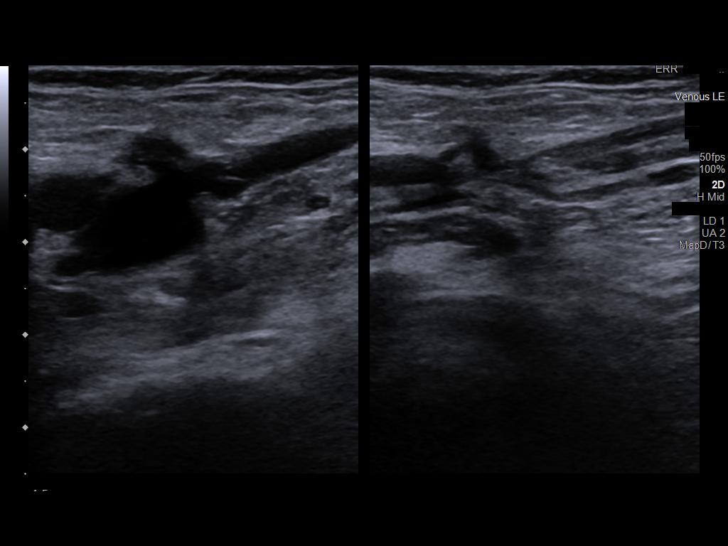
[im 6/61]
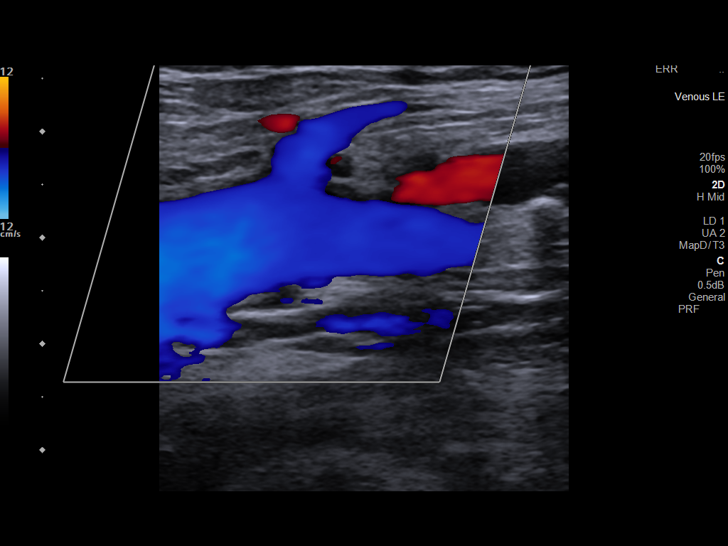
[im 11/61]
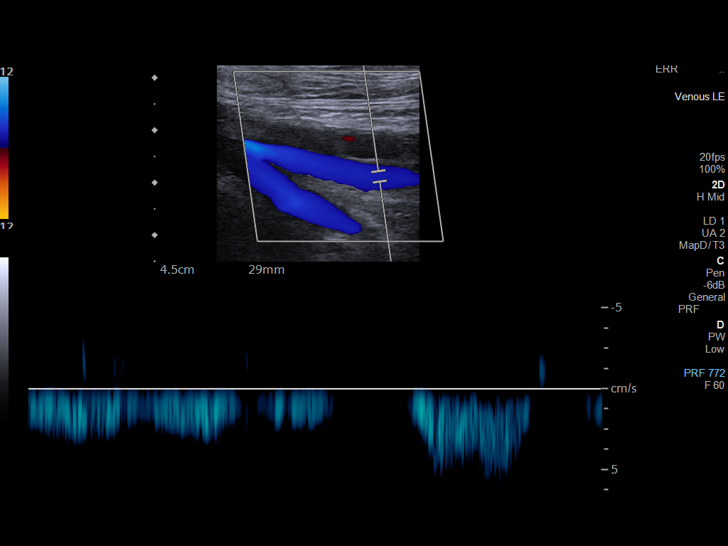
[im 16/61]
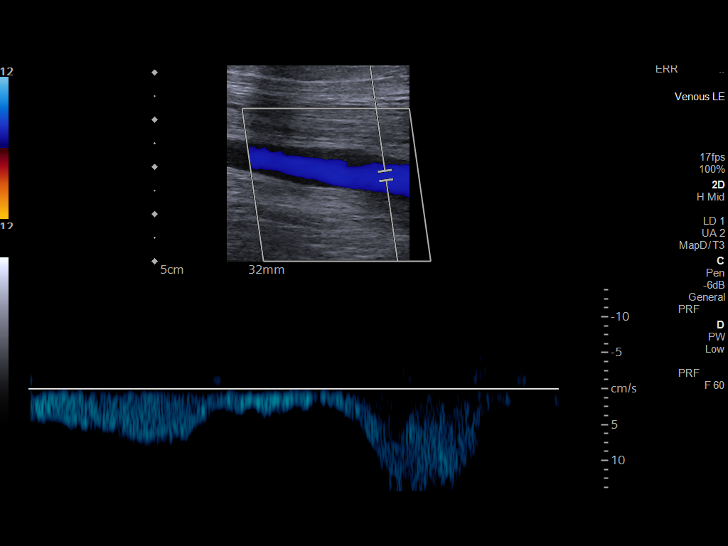
[im 21/61]
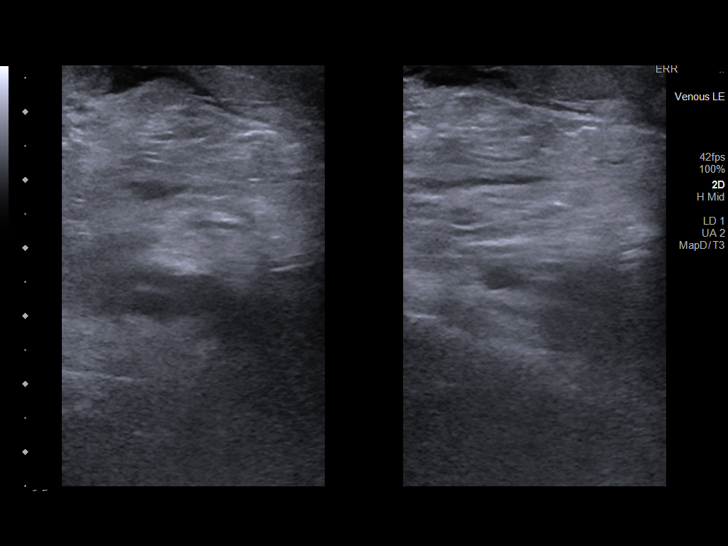
[im 27/61]
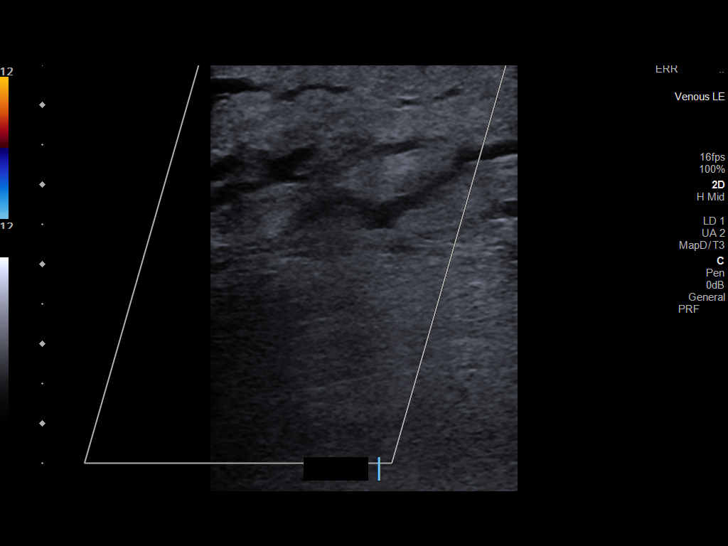
[im 32/61]
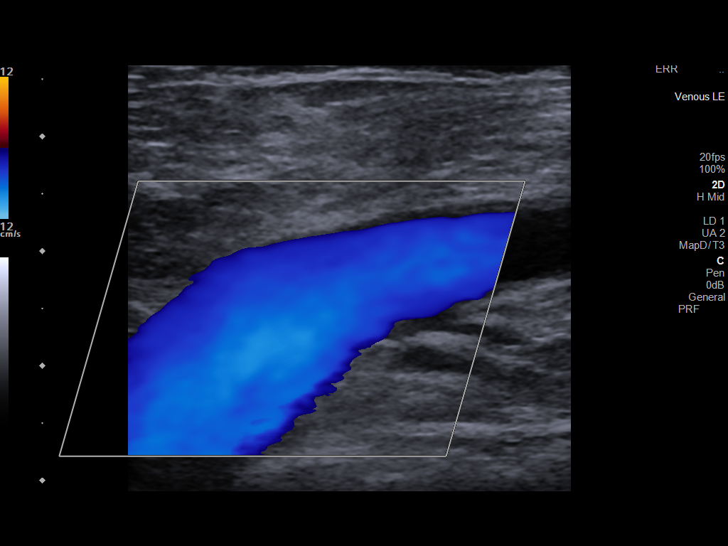
[im 34/61]
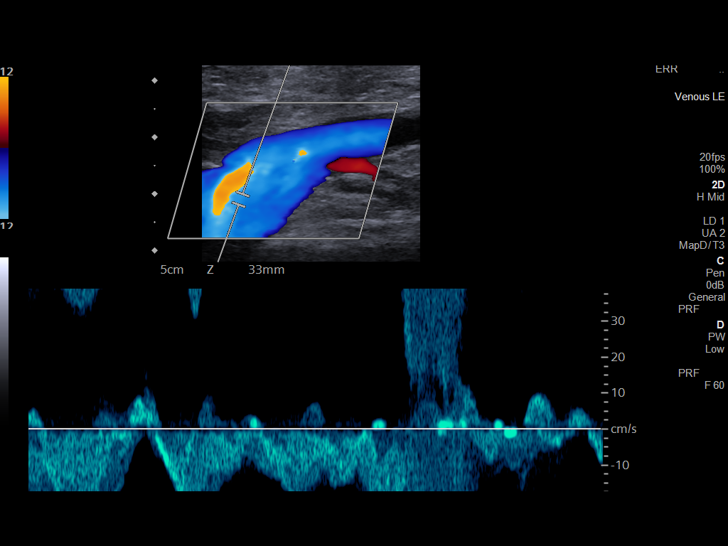
[im 40/61]
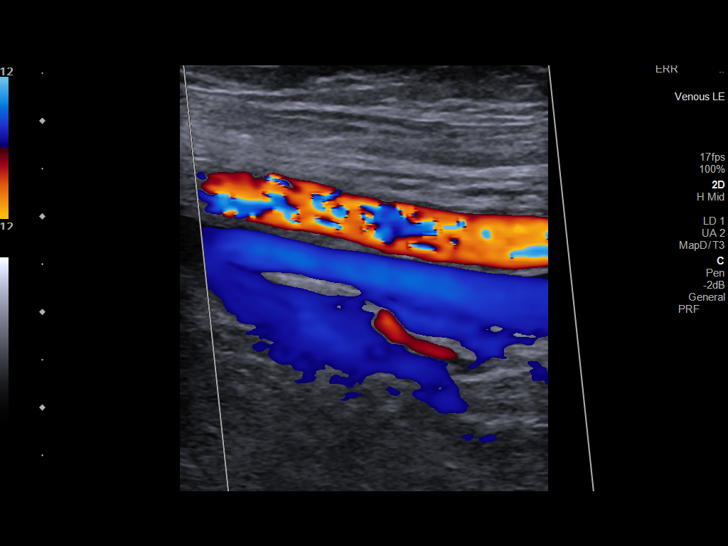
[im 45/61]
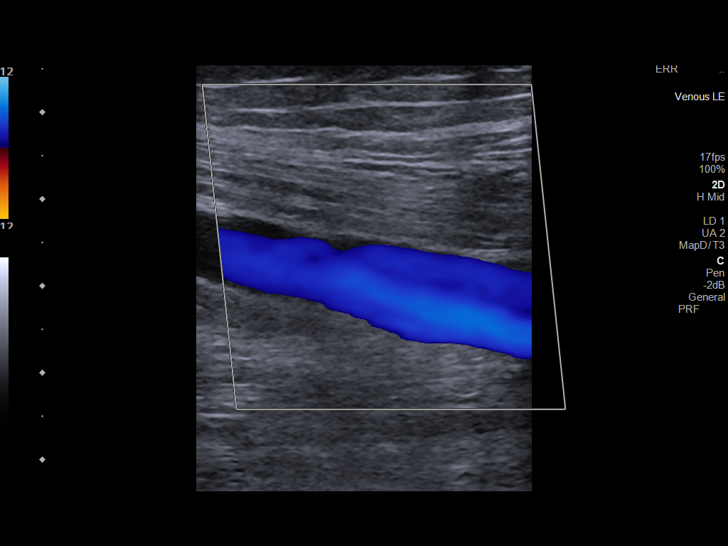
[im 50/61]
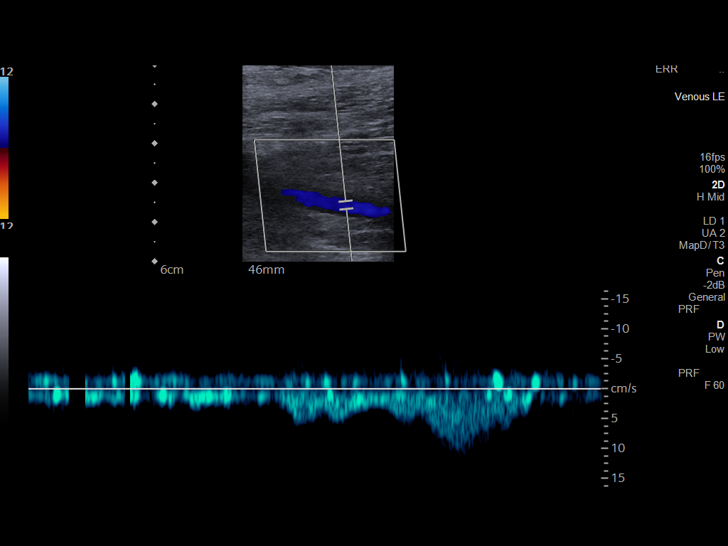
[im 55/61]
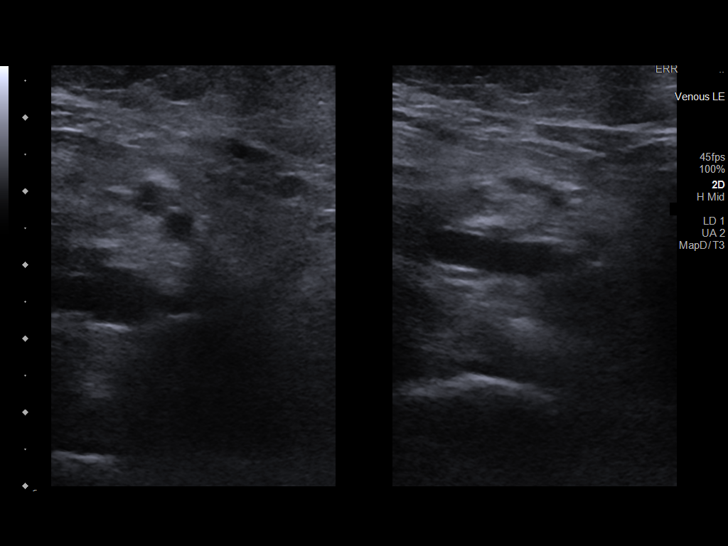
[im 61/61]
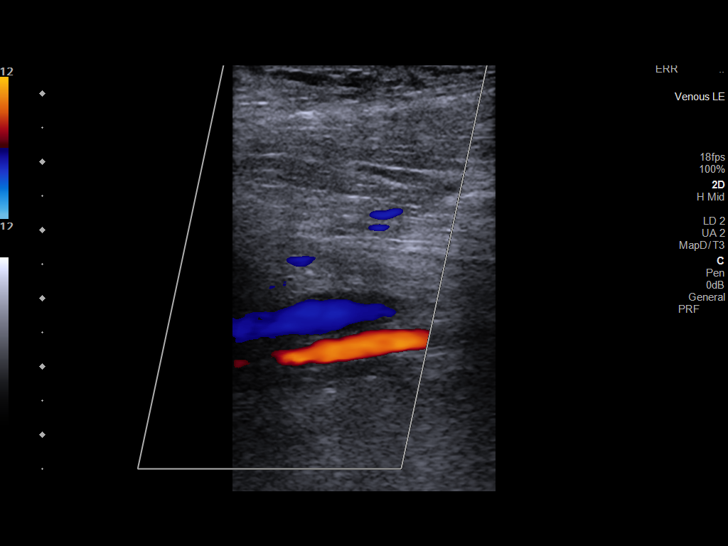

[13 of 24 positions shown; findings below may reference images not displayed]

FINDINGS: RIGHT LOWER EXTREMITY

Common Femoral Vein: No evidence of thrombus. Normal
compressibility, respiratory phasicity and response to augmentation.

Saphenofemoral Junction: No evidence of thrombus. Normal
compressibility and flow on color Doppler imaging.

Profunda Femoral Vein: No evidence of thrombus. Normal
compressibility and flow on color Doppler imaging.

Femoral Vein: No evidence of thrombus. Normal compressibility,
respiratory phasicity and response to augmentation.

Popliteal Vein: No evidence of thrombus. Normal compressibility,
respiratory phasicity and response to augmentation.

Calf Veins: No evidence of thrombus. Normal compressibility and flow
on color Doppler imaging.

Superficial Great Saphenous Vein: No evidence of thrombus. Normal
compressibility.

Venous Reflux:  None.

Other Findings: No evidence of superficial thrombophlebitis or
abnormal fluid collection.

LEFT LOWER EXTREMITY

Common Femoral Vein: No evidence of thrombus. Normal
compressibility, respiratory phasicity and response to augmentation.

Saphenofemoral Junction: No evidence of thrombus. Normal
compressibility and flow on color Doppler imaging.

Profunda Femoral Vein: No evidence of thrombus. Normal
compressibility and flow on color Doppler imaging.

Femoral Vein: No evidence of thrombus. Normal compressibility,
respiratory phasicity and response to augmentation.

Popliteal Vein: No evidence of thrombus. Normal compressibility,
respiratory phasicity and response to augmentation.

Calf Veins: No evidence of thrombus. Normal compressibility and flow
on color Doppler imaging.

Superficial Great Saphenous Vein: No evidence of thrombus. Normal
compressibility.

Venous Reflux:  None.

Other Findings: No evidence of superficial thrombophlebitis or
abnormal fluid collection.
IMPRESSION: No evidence of deep venous thrombosis in either lower extremity.

## 2022-01-26 IMAGING — DX DG CHEST 1V
1 series · 1 of 1 positions shown · non-contrast
Comparison: 11/19/2020

CLINICAL DATA: Bilateral leg pain and swelling

EXAM:
CHEST  1 VIEW

[chest ap]
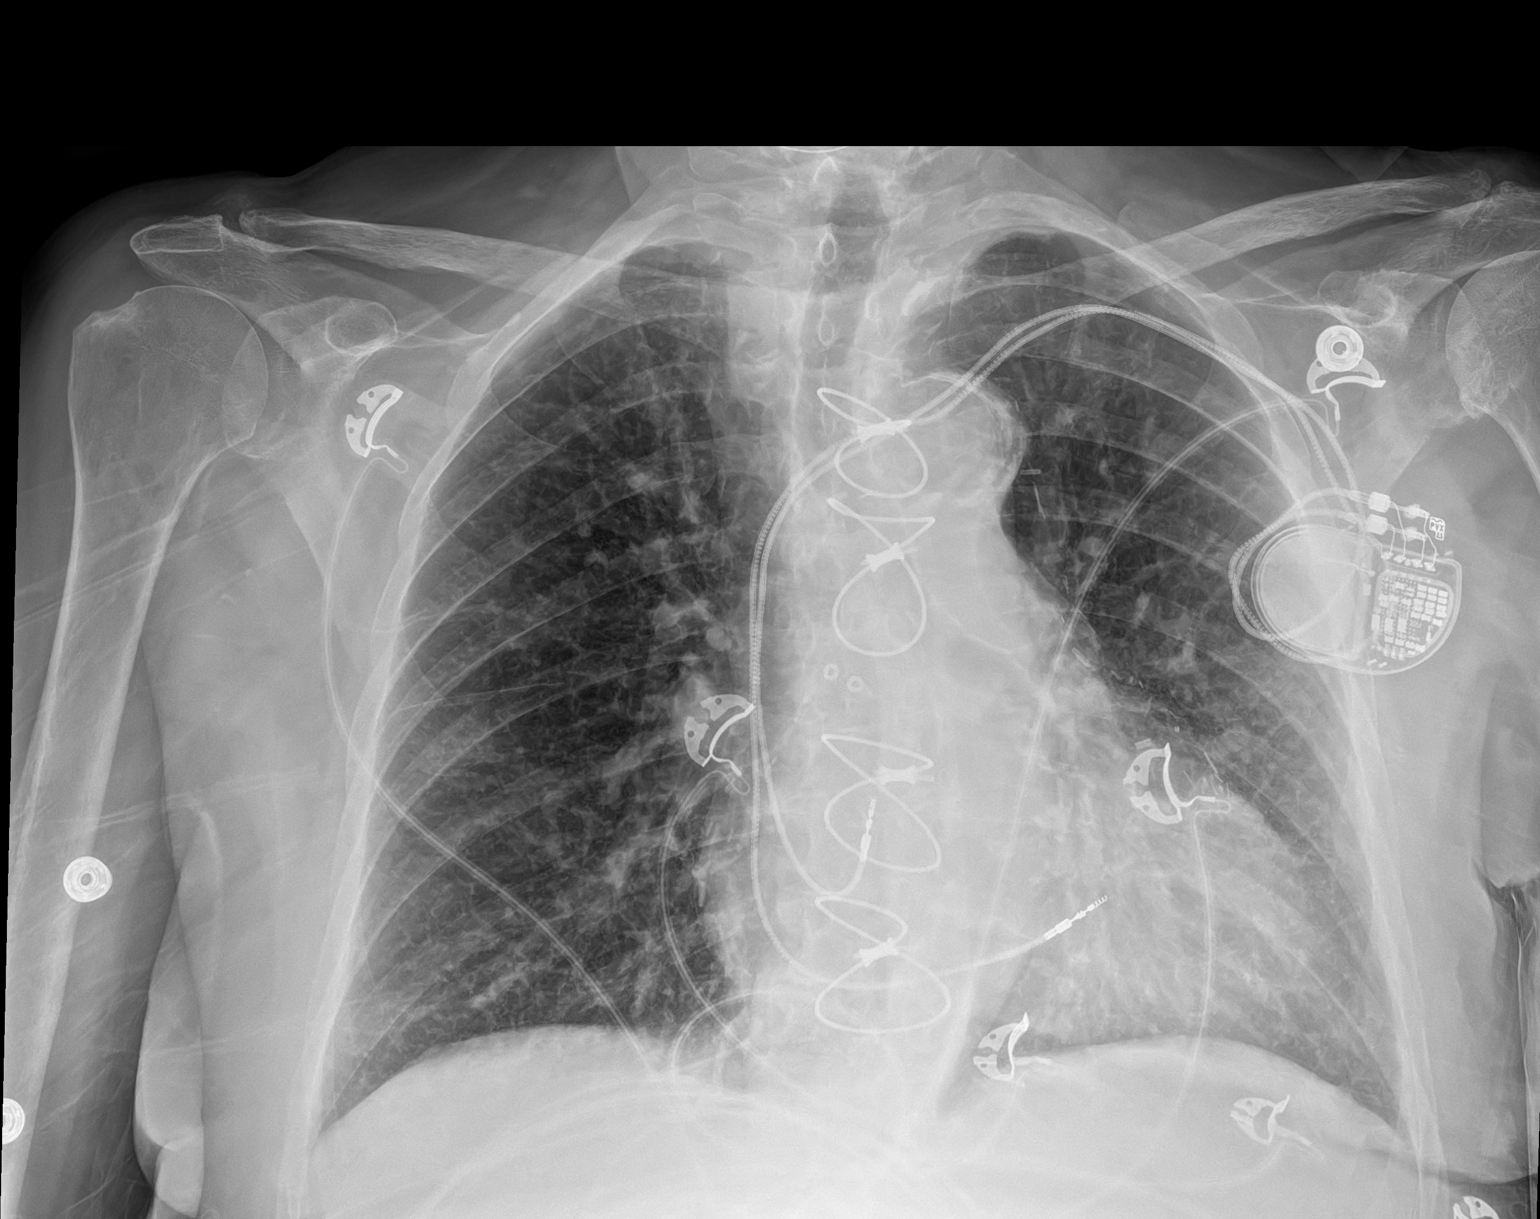

[1 of 1 positions shown; findings below may reference images not displayed]

FINDINGS: Bilateral mild interstitial thickening. No focal consolidation. No
pleural effusion or pneumothorax. Stable cardiomegaly. Prior CABG. 2
lead cardiac pacemaker.

No acute osseous abnormality.
IMPRESSION: 1. Cardiomegaly with mild pulmonary vascular congestion.
